# Patient Record
Sex: Female | Born: 1985
Health system: Southern US, Community
[De-identification: ages and names within clinical notes are randomized; demographics above are authoritative.]

## PROBLEM LIST (undated history)

## (undated) DIAGNOSIS — K219 Gastro-esophageal reflux disease without esophagitis: Secondary | ICD-10-CM

## (undated) DIAGNOSIS — I7300 Raynaud's syndrome without gangrene: Secondary | ICD-10-CM

## (undated) DIAGNOSIS — N390 Urinary tract infection, site not specified: Secondary | ICD-10-CM

## (undated) DIAGNOSIS — Z8759 Personal history of other complications of pregnancy, childbirth and the puerperium: Secondary | ICD-10-CM

## (undated) DIAGNOSIS — B019 Varicella without complication: Secondary | ICD-10-CM

## (undated) DIAGNOSIS — M21611 Bunion of right foot: Secondary | ICD-10-CM

## (undated) DIAGNOSIS — M21612 Bunion of left foot: Secondary | ICD-10-CM

## (undated) DIAGNOSIS — I10 Essential (primary) hypertension: Secondary | ICD-10-CM

## (undated) HISTORY — DX: Bunion of right foot: M21.611

## (undated) HISTORY — DX: Essential (primary) hypertension: I10

## (undated) HISTORY — DX: Raynaud's syndrome without gangrene: I73.00

## (undated) HISTORY — DX: Bunion of right foot: M21.612

## (undated) HISTORY — DX: Gastro-esophageal reflux disease without esophagitis: K21.9

## (undated) HISTORY — DX: Varicella without complication: B01.9

## (undated) HISTORY — DX: Urinary tract infection, site not specified: N39.0

## (undated) HISTORY — DX: Personal history of other complications of pregnancy, childbirth and the puerperium: Z87.59

---

## 2011-09-29 DIAGNOSIS — N3090 Cystitis, unspecified without hematuria: Secondary | ICD-10-CM | POA: Insufficient documentation

## 2011-12-13 DIAGNOSIS — N912 Amenorrhea, unspecified: Secondary | ICD-10-CM | POA: Insufficient documentation

## 2011-12-13 DIAGNOSIS — N390 Urinary tract infection, site not specified: Secondary | ICD-10-CM | POA: Insufficient documentation

## 2011-12-13 DIAGNOSIS — I7300 Raynaud's syndrome without gangrene: Secondary | ICD-10-CM | POA: Insufficient documentation

## 2011-12-13 DIAGNOSIS — B029 Zoster without complications: Secondary | ICD-10-CM | POA: Insufficient documentation

## 2012-05-19 DIAGNOSIS — H5210 Myopia, unspecified eye: Secondary | ICD-10-CM | POA: Insufficient documentation

## 2012-05-19 DIAGNOSIS — H52209 Unspecified astigmatism, unspecified eye: Secondary | ICD-10-CM | POA: Insufficient documentation

## 2013-09-19 DIAGNOSIS — Z8349 Family history of other endocrine, nutritional and metabolic diseases: Secondary | ICD-10-CM | POA: Insufficient documentation

## 2014-05-27 DIAGNOSIS — B977 Papillomavirus as the cause of diseases classified elsewhere: Secondary | ICD-10-CM | POA: Insufficient documentation

## 2015-06-12 DIAGNOSIS — K219 Gastro-esophageal reflux disease without esophagitis: Secondary | ICD-10-CM | POA: Insufficient documentation

## 2015-08-19 ENCOUNTER — Ambulatory Visit (INDEPENDENT_AMBULATORY_CARE_PROVIDER_SITE_OTHER): Payer: BC Managed Care – PPO | Admitting: Family Medicine

## 2015-08-19 ENCOUNTER — Encounter: Payer: Self-pay | Admitting: Family Medicine

## 2015-08-19 VITALS — BP 116/74 | HR 80 | Temp 98.1°F

## 2015-08-19 DIAGNOSIS — J069 Acute upper respiratory infection, unspecified: Secondary | ICD-10-CM | POA: Diagnosis not present

## 2015-08-19 DIAGNOSIS — F902 Attention-deficit hyperactivity disorder, combined type: Secondary | ICD-10-CM | POA: Diagnosis not present

## 2015-08-19 DIAGNOSIS — F909 Attention-deficit hyperactivity disorder, unspecified type: Secondary | ICD-10-CM | POA: Insufficient documentation

## 2015-08-19 MED ORDER — AMPHETAMINE-DEXTROAMPHETAMINE 30 MG PO TABS: 30.0000 mg | ORAL_TABLET | Freq: Two times a day (BID) | ORAL | Status: DC

## 2015-08-19 MED ORDER — AMPHETAMINE-DEXTROAMPHETAMINE 5 MG PO TABS: 5.0000 mg | ORAL_TABLET | Freq: Every day | ORAL | Status: DC

## 2015-08-19 NOTE — Progress Notes (Signed)
Patient ID: Jacqueline Craig, female   DOB: 1986/06/27, 29 y.o.   MRN: [id number]  Tommi Rumps, MD Phone: [phone]  Jacqueline Craig is a 29 y.o. female who presents today for new patient visit.  ADHD: Patient notes she's been on medication for ADHD since 2004. Has been followed by her PCP in [place] for this. She is on Adderall 30 mg daily for this, though states when she is not in school she takes half this dose. When she is in school she takes 30 mg most of the time. She does take a 5 mg "boost dose" some days out of week. She notes her major issues with not on the medicine or attention when driving, listening, and reading. She's not had any blood pressure issues or palpitations with this. She does not have any appetite suppression. Notes her weight has been stable.  URI: Patient notes 1-2 days of nonproductive cough and scratchy throat. Notes this started after drinking after her friend at a [employer]. She notes feeling mildly tired with this. She's not had any fevers, earaches, or shortness of breath with this. She is staying well-hydrated.  Active Ambulatory Problems    Diagnosis Date Noted  . Attention deficit hyperactivity disorder (ADHD) 08/19/2015  . URI (upper respiratory infection) 08/19/2015   Resolved Ambulatory Problems    Diagnosis Date Noted  . No Resolved Ambulatory Problems   Past Medical History  Diagnosis Date  . Chickenpox   . GERD (gastroesophageal reflux disease)   . High blood pressure   . UTI (lower urinary tract infection)   . Raynaud phenomenon   . Bilateral bunions     Family History  Problem Relation Age of Onset  . Alcoholism      Parent  . Arthritis      Grandparent  . Prostate cancer      Father  . Hyperlipidemia      Grandparent  . Heart disease      Grandparent  . Stroke      Grandparent  . Hypertension      Grandparent  . Mental illness      Other relative  . Diabetes      Grandparent    Social History   Social  History  . Marital Status: Married    Spouse Name: N/A  . Number of Children: N/A  . Years of Education: N/A   Occupational History  . Not on file.   Social History Main Topics  . Smoking status: Former Research scientist (life sciences)  . Smokeless tobacco: Not on file  . Alcohol Use: 4.2 oz/week    7 Standard drinks or equivalent per week  . Drug Use: No  . Sexual Activity: Not on file   Other Topics Concern  . Not on file   Social History Narrative  . No narrative on file    ROS   General:  Negative for unweight loss, fever Skin: Negative for new or changing mole, sore that won't heal HEENT: Negative for trouble hearing, trouble seeing, ringing in ears, mouth sores, hoarseness, change in voice, dysphagia. CV:  Negative for chest pain, dyspnea, edema, palpitations Resp: Negative for cough, dyspnea, hemoptysis GI: Negative for nausea, vomiting, diarrhea, constipation, abdominal pain, melena, hematochezia. GU: Negative for dysuria, incontinence, urinary hesitance, hematuria, vaginal or penile discharge, polyuria, sexual difficulty, lumps in testicle or breasts MSK: Negative for muscle cramps or aches, joint pain or swelling Neuro: Negative for headaches, weakness, numbness, dizziness, passing out/fainting Psych: Negative for depression, anxiety, memory problems  Objective  Physical Exam Filed Vitals:   08/19/15 0920  BP: 116/74  Pulse: 80  Temp: 98.1 F (36.7 C)   Physical Exam  Constitutional: She is well-developed, well-nourished, and in no distress.  HENT:  Head: Normocephalic and atraumatic.  Right Ear: External ear normal.  Left Ear: External ear normal.  Mouth/Throat: Oropharynx is clear and moist. No oropharyngeal exudate.  Normal TMs bilaterally  Eyes: Conjunctivae are normal. Pupils are equal, round, and reactive to light.  Neck: Neck supple.  Cardiovascular: Normal rate, regular rhythm and normal heart sounds.  Exam reveals no gallop and no friction rub.   No murmur  heard. Pulmonary/Chest: Effort normal. No respiratory distress. She has no wheezes. She has no rales.  Abdominal: Soft. Bowel sounds are normal. She exhibits no distension. There is no tenderness. There is no rebound and no guarding.  Musculoskeletal: She exhibits no edema.  Lymphadenopathy:    She has no cervical adenopathy.  Neurological: She is alert. Gait normal.  Skin: Skin is warm and dry. She is not diaphoretic.  Psychiatric: Mood and affect normal.     Assessment/Plan:   Attention deficit hyperactivity disorder (ADHD) Patient with many year history of ADHD. She brings in documentation from psychological evaluation. Dosing of Adderall confirmed with patient's prior PCP office. Appears well-controlled at this time. Tolerating well. We will refill this for a month and request records. She'll follow-up in one month.  URI (upper respiratory infection) Symptoms likely early phases of viral URI. Patient is well-appearing. Vitals are stable. We will continue to monitor this. She is given return precautions.   Tommi Rumps

## 2015-08-19 NOTE — Assessment & Plan Note (Signed)
Patient with many year history of ADHD. She brings in documentation from psychological evaluation. Dosing of Adderall confirmed with patient's prior PCP office. Appears well-controlled at this time. Tolerating well. We will refill this for a month and request records. She'll follow-up in one month.

## 2015-08-19 NOTE — Patient Instructions (Signed)
Nice to meet you. Please monitor your scratchy throat and cough. If this worsens please let us know.  If you develop shortness of breath, fever, worsening cough, or any new or change in symptoms please seek medical attention.

## 2015-08-19 NOTE — Progress Notes (Signed)
Pre visit review using our clinic review tool, if applicable. No additional management support is needed unless otherwise documented below in the visit note. 

## 2015-08-19 NOTE — Assessment & Plan Note (Addendum)
Symptoms likely early phases of viral URI. Patient is well-appearing. Vitals are stable. We will continue to monitor this. She is given return precautions.

## 2015-09-09 ENCOUNTER — Ambulatory Visit: Payer: BC Managed Care – PPO | Attending: Obstetrics and Gynecology | Admitting: Physical Therapy

## 2015-09-09 DIAGNOSIS — M791 Myalgia: Secondary | ICD-10-CM | POA: Diagnosis present

## 2015-09-09 DIAGNOSIS — IMO0001 Reserved for inherently not codable concepts without codable children: Secondary | ICD-10-CM

## 2015-09-09 DIAGNOSIS — R279 Unspecified lack of coordination: Secondary | ICD-10-CM

## 2015-09-09 DIAGNOSIS — M609 Myositis, unspecified: Secondary | ICD-10-CM | POA: Insufficient documentation

## 2015-09-09 NOTE — Patient Instructions (Addendum)
Read "Why Do I Still Hurt" and "The Breathing Book" and info on self-compassion  Create relaxation corner in apartment with pictures of [place]   Start thinking of strategies to manage stress of grad school:        Make use of psychotherapy services at [place] out down time for relaxation / exercise during school schedule    Handout on log rolling

## 2015-09-10 NOTE — Therapy (Signed)
[place] MAIN [facility] SERVICES [address] [place], [place], [address] Phone: [phone]   Fax:  [phone]  Physical Therapy Evaluation  Patient Details  Name: Nova Worman MRN: [id number] Date of Birth: Mar 26, 1986 Referring Provider: Leafy Ro  Encounter Date: 09/09/2015      PT End of Session - 09/10/15 1557    Visit Number 1   Number of Visits 12   Date for PT Re-Evaluation 10/10/15   PT Start Time 1522   PT Stop Time 1620   PT Time Calculation (min) 58 min   Activity Tolerance Patient tolerated treatment well;No increased pain   Behavior During Therapy [facility] for tasks assessed/performed      Past Medical History  Diagnosis Date  . Chickenpox   . GERD (gastroesophageal reflux disease)   . High blood pressure   . UTI (lower urinary tract infection)   . Raynaud phenomenon   . Bilateral bunions     No past surgical history on file.  There were no vitals filed for this visit.  Visit Diagnosis:  Myalgia and myositis - Plan: PT plan of care cert/re-cert  Lack of coordination - Plan: PT plan of care cert/re-cert      Subjective Assessment - 09/09/15 1609    Subjective Pt reported pain with initial penetration with intercourse and low back pain. Pt expressed concern if some of the exercises (i.e.plank)  she has been performing Marijo File routine) has been causing her pain. Pt was tearful when explaining how performing Madlyn Frankel routine has really helped her to feel stronger and helped to managed her stress during stressful time while being in school and working. 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 Pt reported her  pelvic pain, GI symptoms seem to be worse with times of stress.  Pt has tried various relaxation techniques but has not found them to be helpful.  Pt denied urinary and bowel problems.  Pt reports her husband is very supportive of her current issues.     Pertinent  History Hx of ADHD, anxiety.  Pt experienced death of her father at an early age. Pt recalls her time in [place] brought her happiness and relaxation. Pt reports being sensitive to sound and she has to use background noise or her iphone earphones to drought out noise from her apartment neighbor's dogs.  Pt also is concerned abut her LBP from sitting for long periods of time reading/ studying at her desk. Pt states she enjoys singing and dancing.    Patient Stated Goals relaxation rechniques, not be in pain with intercourse (initial penetration 4/10 to 0/10), low back pain 2/10 to 0/10             [facility] PT Assessment - 09/10/15 1532    Assessment   Medical Diagnosis dyspareunia    Referring Provider Leafy Ro   Precautions   Precautions None   Restrictions   Weight Bearing Restrictions No   Balance Screen   Has the patient fallen in the past 6 months No   Observation/Other Assessments   Observations slumped position, tearful during session   Other Surveys  --  PSQI 57%,ZUNG 49%, (lower % better), FSFI 66% (high % better   Coordination   Gross Motor Movements are Fluid and Coordinated --  abdominal straining w/ cue for bowel movement   Fine Motor Movements are Fluid and Coordinated --  chest breathing   Other:   Other/ Comments will assess Shea Stakes  Michael video routine positions   Posture/Postural Control   Posture Comments lumobopelvic instability (obliques compensation) w/ ASLR    ROM / Strength   AROM / PROM / Strength --  increased mobility in spinal motions   Palpation   Palpation comment tenderness on R deep transverse diaphragm mm of pelvic floor mm (external palpation thru clothes in hookyling)    Bed Mobility   Bed Mobility --  crunch position, able to log roll                   [facility] Adult PT Treatment/Exercise - 09/10/15 1532    Self-Care   Self-Care --  see pt instructions, POC, anatomy and physiology, nn system    Therapeutic Activites    Therapeutic  Activities --  bed mobility and toileting technique                 PT Education - 09/10/15 1556    Education provided Yes   Education Details HEP, POC, goals, biopsychosocial approach to pain, neuroscience of pain, retraining autonomic nn sysytem, anatomy and physiology, motivational interviewing   Person(s) Educated Patient   Methods Explanation;Demonstration;Tactile cues;Verbal cues;Handout   Comprehension Verbalized understanding;Returned demonstration;Verbal cues required;Tactile cues required             PT Long Term Goals - 09/10/15 1607    PT LONG TERM GOAL #1   Title Pt will decrease score on  PSQI 57% to <47% in order to demo improved sleep quality.    Time 12   Period Weeks   Status New   PT LONG TERM GOAL #2   Title Pt will decrease score on ZUNG 49% to < 45% in order to demo IND with  stress/ anxiety management for decreased pain.    Time 12   Period Weeks   Status New   PT LONG TERM GOAL #3   Title Pt will demo increased score on FSFI 66% to > 76% in order to demo improved QOL with decreased pain and increased intimacy with husband.    Time 12   Period Weeks   Status New   PT LONG TERM GOAL #4   Title Pt will demo no lumbopelvic instability with ASLR and dynamic stabilization exercises level 1-4 (3 reps) without cuing in order to apply deep core activation in functional activities to decrease LBP.    Time 12   Period Weeks   Status New   PT LONG TERM GOAL #5   Title Pt will demo proper body mechanics and modifications with proper alignment and mm engagement with 5 Julian Micheals workout exercises in order to minimize relapse of Sx.    Time 12   Period Weeks   Status New   Additional Long Term Goals   Additional Long Term Goals Yes   PT LONG TERM GOAL #6   Title Pt will demo no tenderness / tensions with palpation to pelvic floor mm in order to improve QOL.    Time 12   Period Weeks   Status New               Plan - 09/10/15 1558     Clinical Impression Statement Pt is a 29 yo female whose complaints include dyspareunia and low back pain. Her S & Sx consist of poor stress management skills, poor sleep quality, increased pelvic floor mm tensions, and poor deep core mm coordination and strength with functional activities such as bowel  elimination and bed mobility.  Pt 's body mechanics and form with exercise routine will be assessed at next session for faulty motor control patterns. Pt benefited from a biopsychosocial approach with motivational interviewing and modification of strategies to address her goals as pt has a Hx of poor stress management.     Pt will benefit from skilled therapeutic intervention in order to improve on the following deficits Pain;Decreased mobility;Increased muscle spasms;Decreased range of motion;Decreased safety awareness;Hypermobility;Postural dysfunction;Improper body mechanics;Decreased strength;Decreased endurance;Decreased activity tolerance;Decreased coordination   Rehab Potential Good   PT Frequency 2x / week   PT Duration 12 weeks   PT Treatment/Interventions ADLs/Self Care Home Management;Electrical Stimulation;Moist Heat;Traction;Balance training;Therapeutic exercise;Therapeutic activities;Functional mobility training;Stair training;Gait training;Neuromuscular re-education;Patient/family education;Manual techniques;Manual lymph drainage;Energy conservation;Dry needling;Passive range of motion;Other (comment)  pain science education, relaxation training   Consulted and Agree with Plan of Care Patient         Problem List Patient Active Problem List   Diagnosis Date Noted  . Attention deficit hyperactivity disorder (ADHD) 08/19/2015  . URI (upper respiratory infection) 08/19/2015    Jerl Mina ,PT, DPT, E-RYT  09/10/2015, 9:01 PM  [place] MAIN [facility] SERVICES [address] [place], [place], [address] Phone: [phone]   Fax:   [phone]  Name: Alysson Ressler MRN: [id number] Date of Birth: 23-Feb-1986

## 2015-09-11 ENCOUNTER — Ambulatory Visit: Payer: Self-pay | Admitting: Family Medicine

## 2015-09-15 ENCOUNTER — Ambulatory Visit: Payer: BC Managed Care – PPO | Attending: Obstetrics and Gynecology | Admitting: Physical Therapy

## 2015-09-15 DIAGNOSIS — M609 Myositis, unspecified: Secondary | ICD-10-CM | POA: Insufficient documentation

## 2015-09-15 DIAGNOSIS — IMO0001 Reserved for inherently not codable concepts without codable children: Secondary | ICD-10-CM

## 2015-09-15 DIAGNOSIS — R279 Unspecified lack of coordination: Secondary | ICD-10-CM

## 2015-09-15 DIAGNOSIS — M791 Myalgia: Secondary | ICD-10-CM | POA: Insufficient documentation

## 2015-09-15 NOTE — Therapy (Signed)
[place] MAIN [facility] SERVICES [address] [place], [place], [address] Phone: [phone]   Fax:  [phone]  Physical Therapy Treatment  Patient Details  Name: Jacqueline Craig MRN: [id number] Date of Birth: August 09, 1986 Referring Provider: Leafy Ro  Encounter Date: 09/15/2015      PT End of Session - 09/15/15 1543    Visit Number 2   Number of Visits 12   Date for PT Re-Evaluation 10/10/15   PT Start Time [id number]   PT Stop Time 1415   PT Time Calculation (min) 60 min   Activity Tolerance Patient tolerated treatment well;No increased pain   Behavior During Therapy [facility] for tasks assessed/performed      Past Medical History  Diagnosis Date  . Chickenpox   . GERD (gastroesophageal reflux disease)   . High blood pressure   . UTI (lower urinary tract infection)   . Raynaud phenomenon   . Bilateral bunions     No past surgical history on file.  There were no vitals filed for this visit.  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 Pt practiced yoga sequence as a way to distress from neighbor noise.     Pertinent History Hx of ADHD, anxiety.  Pt experienced death of her father at an early age. Pt recalls her time in [place] brought her happiness and relaxation. Pt reports being sensitive to sound and she has to use background noise or her iphone earphones to drought out noise from her apartment neighbor's dogs.  Pt also is concerned abut her LBP from sitting for long periods of time reading/ studying at her desk. Pt states she enjoys singing and dancing.    Patient Stated Goals relaxation rechniques, not be in pain with intercourse (initial penetration 4/10 to 0/10), low back  pain 2/10 to 0/10             [facility] PT Assessment - 09/15/15 1444    Observation/Other Assessments   Observations large bunions and genu hallux bilaterally    Jumping   Comments pre-Tx: lands on heels, p! on feet, post-Tx: exhale on landing on forefoot no p!    Other:   Other/ Comments down dog: palms too close    Posture/Postural Control   Posture Comments hyperextended knees, chest breathing, pronated feet bilaterally, genu valgus   Palpation   Palpation comment increased tensions at plantar fascia bilaterally                  Pelvic Floor Special Questions - 09/15/15 1502    Pelvic Floor Internal Exam pt consented verbally without contraindications    Exam Type Vaginal   Palpation increased mm tensions on R > L 2nd,  L > R 3 rd layer    Biofeedback able to faciliatate ROM w.  cuing w/ coordinated breathing   cued for pause btw inhale. exhale, less chest breathing            [facility] Adult PT Treatment/Exercise - 09/15/15 1444    Self-Care   Self-Care (p) --  feet massage, wide shoes w/ good sole support   Neuro Re-ed    Neuro Re-ed Details  proper standing posture w/  core engaged from feet up lower kinetic chain  alignment and form in self-selected yoga routine (down dog)    Exercises   Exercises --  frog stretch and child pose rocking    Manual Therapy   Internal Pelvic Floor thiele massage on 2nd and thrid layer of pelvic floor (pre-Tx: tenderness and tensions), Post-Tx : decreased                 PT Education - 09/15/15 1542    Education provided Yes   Education Details HEP   Person(s) Educated Patient   Methods Explanation;Demonstration;Tactile cues;Verbal cues;Handout   Comprehension Verbalized understanding;Returned demonstration             PT Long Term Goals - 09/10/15 1607    PT LONG TERM GOAL #1   Title Pt will decrease score on  PSQI 57% to <47% in order to demo improved sleep quality.    Time 12   Period Weeks   Status  New   PT LONG TERM GOAL #2   Title Pt will decrease score on ZUNG 49% to < 45% in order to demo IND with  stress/ anxiety management for decreased pain.    Time 12   Period Weeks   Status New   PT LONG TERM GOAL #3   Title Pt will demo increased score on FSFI 66% to > 76% in order to demo improved QOL with decreased pain and increased intimacy with husband.    Time 12   Period Weeks   Status New   PT LONG TERM GOAL #4   Title Pt will demo no lumbopelvic instability with ASLR and dynamic stabilization exercises level 1-4 (3 reps) without cuing in order to apply deep core activation in functional activities to decrease LBP.    Time 12   Period Weeks   Status New   PT LONG TERM GOAL #5   Title Pt will demo proper body mechanics and modifications with proper alignment and mm engagement with 5 Julian Micheals workout exercises in order to minimize relapse of Sx.    Time 12   Period Weeks   Status New   Additional Long Term Goals   Additional Long Term Goals Yes   PT LONG TERM GOAL #6   Title Pt will demo no tenderness / tensions with palpation to pelvic floor mm in order to improve QOL.    Time 12   Period Weeks   Status New               Plan - 09/15/15 1543    Clinical Impression Statement Pt tolerated internal manual Tx today with decreased tenderness and mm tensions in the 2nd and 3rd layers of the pelvic floor mm. Pt also demo'd improved pelvic floor ROM with proper coordination of breathing with cuing to slow the breath and to add a pause. Pt required  neuromuscular training to improve collapsed arch in bilateral feet and pain related to bunions within specific exercises in her self-selected fitness routine. Pt will continue to benefit from skilled PT.   Pt will benefit from skilled therapeutic intervention in order to improve on the following deficits Pain;Decreased mobility;Increased muscle spasms;Decreased range of motion;Decreased safety awareness;Hypermobility;Postural  dysfunction;Improper body mechanics;Decreased strength;Decreased endurance;Decreased activity tolerance;Decreased coordination   Rehab Potential Good   PT Frequency 2x / week   PT Duration 12 weeks   PT Treatment/Interventions ADLs/Self Care Home Management;Electrical Stimulation;Moist Heat;Traction;Balance training;Therapeutic exercise;Therapeutic activities;Functional mobility training;Stair training;Gait training;Neuromuscular re-education;Patient/family education;Manual techniques;Manual lymph drainage;Energy  conservation;Dry needling;Passive range of motion;Other (comment)  pain science education, relaxation training   Consulted and Agree with Plan of Care Patient        Problem List Patient Active Problem List   Diagnosis Date Noted  . Attention deficit hyperactivity disorder (ADHD) 08/19/2015  . URI (upper respiratory infection) 08/19/2015    Jerl Mina ,PT, DPT, E-RYT  09/15/2015, 3:48 PM  [place] MAIN [facility] SERVICES [address] [place], [place], [address] Phone: [phone]   Fax:  [phone]  Name: Glodine Elick MRN: [id number] Date of Birth: June 09, 1986

## 2015-09-15 NOTE — Patient Instructions (Signed)
Handouts given

## 2015-09-17 ENCOUNTER — Ambulatory Visit (INDEPENDENT_AMBULATORY_CARE_PROVIDER_SITE_OTHER): Payer: BC Managed Care – PPO | Admitting: Family Medicine

## 2015-09-17 ENCOUNTER — Encounter: Payer: Self-pay | Admitting: Family Medicine

## 2015-09-17 VITALS — BP 104/62 | HR 75 | Temp 98.5°F | Wt 132.4 lb

## 2015-09-17 DIAGNOSIS — R002 Palpitations: Secondary | ICD-10-CM | POA: Insufficient documentation

## 2015-09-17 DIAGNOSIS — F902 Attention-deficit hyperactivity disorder, combined type: Secondary | ICD-10-CM

## 2015-09-17 DIAGNOSIS — Z8719 Personal history of other diseases of the digestive system: Secondary | ICD-10-CM

## 2015-09-17 DIAGNOSIS — R1011 Right upper quadrant pain: Secondary | ICD-10-CM | POA: Insufficient documentation

## 2015-09-17 DIAGNOSIS — Z87891 Personal history of nicotine dependence: Secondary | ICD-10-CM | POA: Diagnosis not present

## 2015-09-17 LAB — CBC
HCT: 40.8 % (ref 36.0–46.0)
Hemoglobin: 13.5 g/dL (ref 12.0–15.0)
MCHC: 33.1 g/dL (ref 30.0–36.0)
MCV: 97.5 fl (ref 78.0–100.0)
PLATELETS: 201 10*3/uL (ref 150.0–400.0)
RBC: 4.19 Mil/uL (ref 3.87–5.11)
RDW: 13.6 % (ref 11.5–15.5)
WBC: 4.8 10*3/uL (ref 4.0–10.5)

## 2015-09-17 MED ORDER — PANTOPRAZOLE SODIUM 40 MG PO TBEC: 40.0000 mg | DELAYED_RELEASE_TABLET | Freq: Every day | ORAL | Status: DC

## 2015-09-17 MED ORDER — AMPHETAMINE-DEXTROAMPHETAMINE 30 MG PO TABS: 30.0000 mg | ORAL_TABLET | Freq: Two times a day (BID) | ORAL | Status: DC

## 2015-09-17 MED ORDER — AMPHETAMINE-DEXTROAMPHETAMINE 5 MG PO TABS: 5.0000 mg | ORAL_TABLET | Freq: Every day | ORAL | Status: DC

## 2015-09-17 MED ORDER — BUPROPION HCL ER (XL) 300 MG PO TB24: 300.0000 mg | ORAL_TABLET | Freq: Every day | ORAL | Status: DC

## 2015-09-17 NOTE — Assessment & Plan Note (Signed)
Patient with recent onset of fluttering sensation. Lasts very briefly. No other symptoms with this. EKG today is reassuring. She had a TSH and electrolytes were checked recently and they're in the normal range. We will check a CBC to make sure she is not anemic. I had a discussion about possible causes including caffeine, Adderall, and PVCs. Symptoms would likely indicate PVCs given brief duration and lack of other symptoms and this can be exacerbated by caffeine intake. She will decrease her caffeine intake. 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 cardiology referral is placed. She is given return precautions.

## 2015-09-17 NOTE — Progress Notes (Signed)
Pre visit review using our clinic review tool, if applicable. No additional management support is needed unless otherwise documented below in the visit note. 

## 2015-09-17 NOTE — Assessment & Plan Note (Signed)
Symptoms well controlled at this time. We will refill Protonix. She has benign abdominal exam. No blood in her stool. Advised to avoid triggers. She is given return precautions.

## 2015-09-17 NOTE — Assessment & Plan Note (Signed)
Stable at this time. We will refill Adderall. I did discuss that we may need to decrease this if she continues to have palpitations despite decreasing her caffeine intake.

## 2015-09-17 NOTE — Progress Notes (Addendum)
Patient ID: Jacqueline Craig, female   DOB: September 15, 1986, 29 y.o.   MRN: [id number]  Tommi Rumps, MD Phone: [phone]  Jacqueline Craig is a 29 y.o. female who presents today for follow-up.  GERD/gastritis, history of: Patient reports a history of gastritis in the past. She had an EGD that revealed this. She did not have any esophagitis. This is per patient report. She's been on Protonix since that time. She had been taking it twice daily, though is now only taking it once daily. This controls her symptoms. Symptoms are worse with drinking acidic drinks, coffee, chewing minty gum, and taking ibuprofen. She has no blood in her stool. She has no abdominal pain now.  ADHD: Patient reports Adderall is helpful. Helps her focus. She is only taking 30 mg in the morning now and 5 mg at night. She notes when she is in class she takes 30 mg twice a day and 5 mg boost in the afternoon. Notes her appetite is good. She has not had any blood pressure or tachycardia issues, though does report an occasional fluttering. See below for documentation of that issue.  History of tobacco abuse: Patient notes she stopped smoking in 2005. She is on nicotine lozenges until 2010. She was started on Wellbutrin 2008 for this issue. She notes this helped her quit using tobacco and nicotine products. She does have a history of depression, though has not had depression in some time and is not on Wellbutrin for depression. She has been continued on Wellbutrin since she started it. She's not interested in stopping it at this time.  Palpitations: Patient notes intermittent fluttering sensation in her chest. She notes it typically occurs after drinking caffeinated drinks, though does occur at other times. She has no chest pain or shortness of breath with this. No lightheadedness with this. It is not exertional. These last for less than a couple of seconds at a time. Has been going on for "a little while". Do not happen every day. No  history of these prior. Not ever had an EKG.  PMH: Former smoker.   ROS see history of present illness  Objective  Physical Exam Filed Vitals:   09/17/15 0956  BP: 104/62  Pulse: 75  Temp: 98.5 F (36.9 C)   Physical Exam  Constitutional: She is well-developed, well-nourished, and in no distress.  HENT:  Head: Normocephalic and atraumatic.  Right Ear: External ear normal.  Left Ear: External ear normal.  Mouth/Throat: Oropharynx is clear and moist. No oropharyngeal exudate.  Neck: Neck supple.  Cardiovascular: Normal rate, regular rhythm and normal heart sounds.   Pulmonary/Chest: Effort normal and breath sounds normal.  Abdominal: Soft. Bowel sounds are normal. She exhibits no distension. There is no tenderness. There is no rebound and no guarding.  Lymphadenopathy:    She has no cervical adenopathy.  Neurological: She is alert. Gait normal.  Skin: Skin is warm and dry. She is not diaphoretic.   EKG: Sinus rhythm, rate 86, short PR interval, no ST or T-wave changes, no arrhythmias seen  Assessment/Plan: Please see individual problem list.  Attention deficit hyperactivity disorder (ADHD) Stable at this time. We will refill Adderall. I did discuss that we may need to decrease this if she continues to have palpitations despite decreasing her caffeine intake.  History of gastritis Symptoms well controlled at this time. We will refill Protonix. She has benign abdominal exam. No blood in her stool. Advised to avoid triggers. She is given return precautions.  History of tobacco  abuse Former smoker. Has been on Wellbutrin for the last 8 years. This was to help her quit smoking. She has been stable on this. I discussed coming off of it given that she is no longer using tobacco products, though patient states she would like to remain on it at this time. We will provide a refill. She will consider coming off of it and let us know if she would like to do  this.  Palpitations Patient with recent onset of fluttering sensation. Lasts very briefly. No other symptoms with this. EKG today is reassuring. She had a TSH and electrolytes were checked recently and they're in the normal range. We will check a CBC to make sure she is not anemic. I had a discussion about possible causes including caffeine, Adderall, and PVCs. Symptoms would likely indicate PVCs given brief duration and lack of other symptoms and this can be exacerbated by caffeine intake. She will decrease her caffeine intake. 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 cardiology referral is placed. She is given return precautions.    Orders Placed This Encounter  Procedures  . CBC  . Ambulatory referral to Cardiology    Referral Priority:  Routine    Referral Type:  Consultation    Referral Reason:  Specialty Services Required    Requested Specialty:  Cardiology    Number of Visits Requested:  1  . EKG 12-Lead    Meds ordered this encounter  Medications  . buPROPion (WELLBUTRIN XL) 300 MG 24 hr tablet    Sig: Take 1 tablet (300 mg total) by mouth daily.    Dispense:  90 tablet    Refill:  1  . pantoprazole (PROTONIX) 40 MG tablet    Sig: Take 1 tablet (40 mg total) by mouth daily.    Dispense:  90 tablet    Refill:  1  . amphetamine-dextroamphetamine (ADDERALL) 30 MG tablet    Sig: Take 1 tablet by mouth 2 (two) times daily.    Dispense:  60 tablet    Refill:  0  . amphetamine-dextroamphetamine (ADDERALL) 5 MG tablet    Sig: Take 1 tablet (5 mg total) by mouth daily.    Dispense:  30 tablet    Refill:  0  . amphetamine-dextroamphetamine (ADDERALL) 30 MG tablet    Sig: Take 1 tablet by mouth 2 (two) times daily. Do not fill until 10/18/15    Dispense:  60 tablet    Refill:  0  . amphetamine-dextroamphetamine  (ADDERALL) 5 MG tablet    Sig: Take 1 tablet (5 mg total) by mouth daily. Do not fill until 10/18/15    Dispense:  30 tablet    Refill:  0  . amphetamine-dextroamphetamine (ADDERALL) 30 MG tablet    Sig: Take 1 tablet by mouth 2 (two) times daily. Please do not fill until 11/18/15    Dispense:  60 tablet    Refill:  0  . amphetamine-dextroamphetamine (ADDERALL) 5 MG tablet    Sig: Take 1 tablet (5 mg total) by mouth daily. Please do not fill until 11/18/15    Dispense:  30 tablet    Refill:  0    Tommi Rumps  \

## 2015-09-17 NOTE — Assessment & Plan Note (Signed)
Former smoker. Has been on Wellbutrin for the last 8 years. This was to help her quit smoking. She has been stable on this. I discussed coming off of it given that she is no longer using tobacco products, though patient states she would like to remain on it at this time. We will provide a refill. She will consider coming off of it and let us know if she would like to do this.

## 2015-09-17 NOTE — Patient Instructions (Signed)
Nice to see you. We will refill your medications. We will check a CBC to rule out anemia as a cause of your palpitations. Please decrease your caffeine intake. Please continue to monitor the palpitations. They continue to be an issue please let us know. If you develop chest pain or trouble breathing with an please let us know as well. If you develop chest pain, shortness of breath, palpitations, abdominal pain, blood in her stool, or any new or changing symptoms please seek medical attention.

## 2015-09-18 ENCOUNTER — Ambulatory Visit: Payer: BC Managed Care – PPO | Admitting: Physical Therapy

## 2015-09-18 DIAGNOSIS — R279 Unspecified lack of coordination: Secondary | ICD-10-CM

## 2015-09-18 DIAGNOSIS — M791 Myalgia: Secondary | ICD-10-CM | POA: Diagnosis not present

## 2015-09-18 DIAGNOSIS — IMO0001 Reserved for inherently not codable concepts without codable children: Secondary | ICD-10-CM

## 2015-09-19 NOTE — Therapy (Signed)
[place] MAIN [facility] SERVICES [address] [place], [place], [address] Phone: [phone]   Fax:  [phone]  Physical Therapy Treatment  Patient Details  Name: Jacqueline Craig MRN: [id number] Date of Birth: 11-21-1985 Referring Provider: Leafy Ro  Encounter Date: 09/18/2015      PT End of Session - 09/19/15 2202    Visit Number 3   Number of Visits 12   Date for PT Re-Evaluation 10/10/15   PT Start Time 0918   PT Stop Time 1015   PT Time Calculation (min) 57 min   Activity Tolerance Patient tolerated treatment well;No increased pain   Behavior During Therapy [facility] for tasks assessed/performed      Past Medical History  Diagnosis Date  . Chickenpox   . GERD (gastroesophageal reflux disease)   . High blood pressure   . UTI (lower urinary tract infection)   . Raynaud phenomenon   . Bilateral bunions     No past surgical history on file.  There were no vitals filed for this visit.  Visit Diagnosis:  Myalgia and myositis  Lack of coordination      Subjective Assessment - 09/18/15 0921    Subjective Pt reported she has practiced her HEP. Pt had no complaints from last sessions.  Pt has practiced landing with proper body mechanics and has had less knee/feet pain during fitness routine.    Pertinent History Hx of ADHD, anxiety.  Pt experienced death of her father at an early age. Pt recalls her time in [place] brought her happiness and relaxation. Pt reports being sensitive to sound and she has to use background noise or her iphone earphones to drought out noise from her apartment neighbor's dogs.  Pt also is concerned abut her LBP from sitting for long periods of time reading/ studying at her desk. Pt states she enjoys singing and dancing.    Patient Stated Goals relaxation rechniques, not be in pain with intercourse (initial penetration 4/10 to 0/10), low back pain 2/10 to 0/10                       Pelvic Floor  Special Questions - 09/19/15 1437    Pelvic Floor Internal Exam pt consented verbally without contraindications    Exam Type Vaginal   Palpation decreased mm tensions on R > L 2nd,  L > R 3 rd layer    Biofeedback improved breathing coordination           [facility] Adult PT Treatment/Exercise - 09/19/15 1437    Self-Care   Self-Care --  explained about dilator use as an option for self-management   Other Self-Care Comments  guided body scan and explained the principles od restroative yoga with use of props    Therapeutic Activites    Therapeutic Activities --  car seat modifification to maintain neutral spine    Manual Therapy   Internal Pelvic Floor thiele massage on 2nd and thrid layer of pelvic floor (pre-Tx: tenderness and tensions), Post-Tx : decreased                      PT Long Term Goals - 09/19/15 1443    PT LONG TERM GOAL #1   Title Pt will decrease score on  PSQI 57% to <47% in order to demo improved sleep quality.    Time 12   Period Weeks   Status On-going   PT LONG TERM GOAL #2   Title  Pt will decrease score on ZUNG 49% to < 45% in order to demo IND with  stress/ anxiety management for decreased pain.    Time 12   Period Weeks   Status On-going   PT LONG TERM GOAL #3   Title Pt will demo increased score on FSFI 66% to > 76% in order to demo improved QOL with decreased pain and increased intimacy with husband.    Time 12   Period Weeks   Status On-going   PT LONG TERM GOAL #4   Title Pt will demo no lumbopelvic instability with ASLR and dynamic stabilization exercises level 1-4 (3 reps) without cuing in order to apply deep core activation in functional activities to decrease LBP.    Time 12   Period Weeks   Status On-going   PT LONG TERM GOAL #5   Title Pt will demo proper body mechanics and modifications with proper alignment and mm engagement with 5 Julian Micheals workout exercises in order to minimize relapse of Sx.    Time 12   Period Weeks    Status New   PT LONG TERM GOAL #6   Title Pt will demo no tenderness / tensions with palpation to pelvic floor mm in order to improve QOL.    Time 12   Period Weeks   Status Partially Met               Plan - 09/19/15 1445    Clinical Impression Statement Pt is progressing well with more symmetrical softness to pelvic floor mm bilaterally  and minor tenderness in deepest pelvic floor mm. Pt showed good carry over with previous neuromuscular training. Plan to progress deep core exercises and to modify workout fitness to minimize relapse of Sx.    Pt will benefit from skilled therapeutic intervention in order to improve on the following deficits Pain;Decreased mobility;Increased muscle spasms;Decreased range of motion;Decreased safety awareness;Hypermobility;Postural dysfunction;Improper body mechanics;Decreased strength;Decreased endurance;Decreased activity tolerance;Decreased coordination   Rehab Potential Good   PT Frequency 2x / week   PT Duration 12 weeks   PT Treatment/Interventions ADLs/Self Care Home Management;Electrical Stimulation;Moist Heat;Traction;Balance training;Therapeutic exercise;Therapeutic activities;Functional mobility training;Stair training;Gait training;Neuromuscular re-education;Patient/family education;Manual techniques;Manual lymph drainage;Energy conservation;Dry needling;Passive range of motion;Other (comment)   Consulted and Agree with Plan of Care Patient        Problem List Patient Active Problem List   Diagnosis Date Noted  . History of gastritis 09/17/2015  . History of tobacco abuse 09/17/2015  . Palpitations 09/17/2015  . Attention deficit hyperactivity disorder (ADHD) 08/19/2015  . URI (upper respiratory infection) 08/19/2015    Jerl Mina  ,PT, DPT, E-RYT  09/19/2015, 2:48 PM  [place] MAIN [facility] SERVICES [address] [place], [place], [address] Phone: [phone]   Fax:   [phone]  Name: Amia Rynders MRN: [id number] Date of Birth: January 20, 1986

## 2015-09-22 ENCOUNTER — Ambulatory Visit: Payer: BC Managed Care – PPO | Admitting: Physical Therapy

## 2015-09-23 ENCOUNTER — Encounter: Payer: BC Managed Care – PPO | Admitting: Physical Therapy

## 2015-09-25 ENCOUNTER — Ambulatory Visit: Payer: BC Managed Care – PPO | Admitting: Physical Therapy

## 2015-09-25 DIAGNOSIS — R279 Unspecified lack of coordination: Secondary | ICD-10-CM

## 2015-09-25 DIAGNOSIS — M791 Myalgia: Secondary | ICD-10-CM | POA: Diagnosis not present

## 2015-09-25 DIAGNOSIS — IMO0001 Reserved for inherently not codable concepts without codable children: Secondary | ICD-10-CM

## 2015-09-26 NOTE — Patient Instructions (Signed)
You are now ready to begin training the deep core muscles system: diaphragm, transverse abdominis, pelvic floor . These muscles must work together as a team.           The key to these exercises to train the brain to coordinate the timing of these muscles and to have them turn on for long periods of time to hold you upright against gravity (especially important if you are on your feet all day).These muscles are postural muscles and play a role stabilizing your spine and bodyweight. By doing these repetitions slowly and correctly instead of doing crunches, you will achieve a flatter belly without a lower pooch. You are also placing your spine in a more neutral position and breathing properly which in turn, decreases your risk for problems related to your pelvic floor, abdominal, and low back such as pelvic organ prolapse, hernias, diastasis recti (separation of superficial muscles), disk herniations, spinal fractures. These exercises set a solid foundation for you to later progress to resistance/ strength training with therabands and weights and return to other typical fitness exercises with a stronger deeper core.   Do level 1 with pillow under hips, Do level 2 30 reps instead of sit ups during Jacqueline Craig workout

## 2015-09-26 NOTE — Therapy (Addendum)
[place] MAIN [facility] SERVICES [address] [place], [place], [address] Phone: [phone]   Fax:  [phone]  Physical Therapy Treatment  Patient Details  Name: Jacqueline Craig MRN: [id number] Date of Birth: October 25, 1985 Referring Provider: Leafy Ro  Encounter Date: 09/25/2015      PT End of Session - 09/26/15 1946    Visit Number 3   Number of Visits 12   Date for PT Re-Evaluation 10/10/15   PT Start Time 1155   PT Stop Time 2080   PT Time Calculation (min) 60 min   Activity Tolerance Patient tolerated treatment well;No increased pain   Behavior During Therapy [facility] for tasks assessed/performed      Past Medical History  Diagnosis Date  . Chickenpox   . GERD (gastroesophageal reflux disease)   . High blood pressure   . UTI (lower urinary tract infection)   . Raynaud phenomenon   . Bilateral bunions     No past surgical history on file.  There were no vitals filed for this visit.  Visit Diagnosis:  Myalgia and myositis  Lack of coordination      Subjective Assessment - 09/25/15 1517    Subjective Pt reported her drive to [place] was not bad her back with use of the towels. Pt noticed her back did not bother her despite picking up a friend's baby ~30 lbs repeatedly across one week.    Pertinent History Hx of ADHD, anxiety.  Pt experienced death of her father at an early age. Pt recalls her time in [place] brought her happiness and relaxation. Pt reports being sensitive to sound and she has to use background noise or her iphone earphones to drought out noise from her apartment neighbor's dogs.  Pt also is concerned abut her LBP from sitting for long periods of time reading/ studying at her desk. Pt states she enjoys singing and dancing.    Patient Stated Goals relaxation rechniques, not be in pain with intercourse (initial penetration 4/10 to 0/10), low back pain 2/10 to 0/10                       Pelvic Floor Special  Questions - 09/26/15 1936    Pelvic Floor Internal Exam pt consented verbally without contraindications    Exam Type Vaginal   Palpation no mm tensions/tenderness. bladder more dorsal position w/ posterior activation of pelvic floor contraction (quick), post-Tx:  bladder more cephalad position and more circumferential contraction    Strength fair squeeze, definite lift   Strength # of reps 10   Strength # of seconds 1           [facility] Adult PT Treatment/Exercise - 09/26/15 1942    Self-Care   Other Self-Care Comments  modified Clemetine Marker work out and educated the importance of avoiding sit-up and crunches   Neuro Re-ed    Neuro Re-ed Details  dynamic stabilization 1-2 (10 reps) , pelvic floor ROM and quick activation,   verbal, visual, tactile cues for pelvic floor activation   Manual Therapy   Myofascial Release suprapubic area   Internal Pelvic Floor anterior pelvic floor faciliation                 PT Education - 09/26/15 1945    Education provided Yes   Education Details HEP   Person(s) Educated Patient   Methods Explanation;Demonstration;Tactile cues;Verbal cues;Handout   Comprehension Verbalized understanding;Returned demonstration;Verbal cues required;Tactile cues required  PT Long Term Goals - 09/19/15 1443    PT LONG TERM GOAL #1   Title Pt will decrease score on  PSQI 57% to <47% in order to demo improved sleep quality.    Time 12   Period Weeks   Status On-going   PT LONG TERM GOAL #2   Title Pt will decrease score on ZUNG 49% to < 45% in order to demo IND with  stress/ anxiety management for decreased pain.    Time 12   Period Weeks   Status On-going   PT LONG TERM GOAL #3   Title Pt will demo increased score on FSFI 66% to > 76% in order to demo improved QOL with decreased pain and increased intimacy with husband.    Time 12   Period Weeks   Status On-going   PT LONG TERM GOAL #4   Title Pt will demo no lumbopelvic instability  with ASLR and dynamic stabilization exercises level 1-4 (3 reps) without cuing in order to apply deep core activation in functional activities to decrease LBP.    Time 12   Period Weeks   Status On-going   PT LONG TERM GOAL #5   Title Pt will demo proper body mechanics and modifications with proper alignment and mm engagement with 5 Julian Micheals workout exercises in order to minimize relapse of Sx.    Time 12   Period Weeks   Status New   PT LONG TERM GOAL #6   Title Pt will demo no tenderness / tensions with palpation to pelvic floor mm in order to improve QOL.    Time 12   Period Weeks   Status Partially Met               Plan - 09/26/15 1946    Clinical Impression Statement Pt benefitted from manual Tx to achieve proper pelvic floor mm ROM and strengthening. Noted slight dorsal position of bladder intitially with more posterior activation of pelvic floor mm. Post-Tx, noted more cephalad position  of bladder and more cicrumferential contraction of all pelvic floor mm.  Pt progressed to deep core strengthening exercises. Pt will continue to benefit from skilled PT.    Pt will benefit from skilled therapeutic intervention in order to improve on the following deficits Pain;Decreased mobility;Increased muscle spasms;Decreased range of motion;Decreased safety awareness;Hypermobility;Postural dysfunction;Improper body mechanics;Decreased strength;Decreased endurance;Decreased activity tolerance;Decreased coordination   Rehab Potential Good   PT Frequency 2x / week   PT Duration 12 weeks   PT Treatment/Interventions ADLs/Self Care Home Management;Electrical Stimulation;Moist Heat;Traction;Balance training;Therapeutic exercise;Therapeutic activities;Functional mobility training;Stair training;Gait training;Neuromuscular re-education;Patient/family education;Manual techniques;Manual lymph drainage;Energy conservation;Dry needling;Passive range of motion;Other (comment)   Consulted and  Agree with Plan of Care Patient        Problem List Patient Active Problem List   Diagnosis Date Noted  . History of gastritis 09/17/2015  . History of tobacco abuse 09/17/2015  . Palpitations 09/17/2015  . Attention deficit hyperactivity disorder (ADHD) 08/19/2015  . URI (upper respiratory infection) 08/19/2015    Jerl Mina ,PT, DPT, E-RYT  09/26/2015, 7:50 PM  [place] MAIN [facility] SERVICES [address] [place], [place], [address] Phone: [phone]   Fax:  [phone]  Name: Jacqueline Craig MRN: [id number] Date of Birth: 1986/09/22   PHYSICAL THERAPY DISCHARGE SUMMARY  Visits from Start of Care: 3   Plan: Patient agrees to discharge.  Patient goals were partially met. Patient is being discharged due to not returning since the last  visit.  ?????     Lyndee Hensen, PT, DPT 2:39 PM  12/27/18

## 2015-10-01 ENCOUNTER — Ambulatory Visit: Payer: BC Managed Care – PPO | Admitting: Physical Therapy

## 2015-10-08 ENCOUNTER — Encounter: Payer: BC Managed Care – PPO | Admitting: Physical Therapy

## 2015-10-10 ENCOUNTER — Encounter: Payer: BC Managed Care – PPO | Admitting: Physical Therapy

## 2015-10-22 ENCOUNTER — Encounter: Payer: Self-pay | Admitting: Physical Therapy

## 2015-10-22 ENCOUNTER — Encounter: Payer: Self-pay | Admitting: Family Medicine

## 2015-11-10 ENCOUNTER — Encounter: Payer: Self-pay | Admitting: Family Medicine

## 2015-11-12 ENCOUNTER — Ambulatory Visit: Payer: Self-pay | Admitting: Cardiovascular Disease

## 2015-11-13 ENCOUNTER — Ambulatory Visit (INDEPENDENT_AMBULATORY_CARE_PROVIDER_SITE_OTHER): Payer: BC Managed Care – PPO

## 2015-11-13 DIAGNOSIS — Z23 Encounter for immunization: Secondary | ICD-10-CM

## 2015-11-13 NOTE — Progress Notes (Signed)
Patient came in for last Hepatitis B series vaccine.  Received in Left deltoid.  Patient tolerated well.

## 2015-12-17 ENCOUNTER — Ambulatory Visit: Payer: Self-pay | Admitting: Family Medicine

## 2016-01-08 ENCOUNTER — Ambulatory Visit (INDEPENDENT_AMBULATORY_CARE_PROVIDER_SITE_OTHER): Payer: BC Managed Care – PPO | Admitting: Family Medicine

## 2016-01-08 ENCOUNTER — Encounter: Payer: Self-pay | Admitting: Family Medicine

## 2016-01-08 VITALS — BP 110/70 | HR 91 | Temp 98.2°F | Ht 65.5 in | Wt 129.0 lb

## 2016-01-08 DIAGNOSIS — F902 Attention-deficit hyperactivity disorder, combined type: Secondary | ICD-10-CM

## 2016-01-08 DIAGNOSIS — R002 Palpitations: Secondary | ICD-10-CM | POA: Diagnosis not present

## 2016-01-08 MED ORDER — AMPHETAMINE-DEXTROAMPHETAMINE 5 MG PO TABS: 5.0000 mg | ORAL_TABLET | Freq: Every day | ORAL | Status: DC

## 2016-01-08 MED ORDER — AMPHETAMINE-DEXTROAMPHETAMINE 20 MG PO TABS: 20.0000 mg | ORAL_TABLET | Freq: Two times a day (BID) | ORAL | Status: DC

## 2016-01-08 MED ORDER — AMPHETAMINE-DEXTROAMPHETAMINE 20 MG PO TABS: 20.0000 mg | ORAL_TABLET | Freq: Every day | ORAL | Status: DC

## 2016-01-08 NOTE — Progress Notes (Signed)
Pre visit review using our clinic review tool, if applicable. No additional management support is needed unless otherwise documented below in the visit note. 

## 2016-01-08 NOTE — Assessment & Plan Note (Signed)
Continues to have issues with this. No chest pain or shortness of breath associated with this. Some of her symptoms sound as though they could be PVCs others sound as though they are persistent palpitations. I discussed possible causes including her caffeine intake and her Adderall in addition to possible Wolff-Parkinson-White based on her short PR interval. Discussed cardiology evaluation and she noted she would think on this and call them as she has previously put this off. We will decrease her dose of Adderall to 20 mg twice daily to see if this will be beneficial. She will continue to monitor with this decrease. She's given return precautions.

## 2016-01-08 NOTE — Progress Notes (Signed)
Patient ID: Jacqueline Craig, female   DOB: 1986/02/08, 30 y.o.   MRN: [id number]  Tommi Rumps, MD Phone: [phone]  Jacqueline Craig is a 30 y.o. female who presents today for follow-up.  ADHD: Patient notes she is taking Adderall 30 mg twice a day and then taking 5 mg as a boost dose in the afternoon. Notes appetite is okay. Weight has been okay. Notes these doses are effective.  She does note continued intermittent palpitations that she describes as a skipped beat. Occasionally does note heart racing and she checks her heart rate and it is in the low 100s. She does drink a lot of caffeine. She denies chest pain and shortness of breath with this. She does have significant amount of anxiety surrounding school. I reviewed her EKG and went over it with her. Discussed short PR interval potential for Wolff-Parkinson-White syndrome as a cause of her palpitations  PMH: Former smoker   ROS see history of present illness  Objective  Physical Exam Filed Vitals:   01/08/16 1400  BP: 110/70  Pulse: 91  Temp: 98.2 F (36.8 C)    BP Readings from Last 3 Encounters:  01/08/16 110/70  09/17/15 104/62  08/19/15 116/74   Wt Readings from Last 3 Encounters:  01/08/16 129 lb (58.514 kg)  09/17/15 132 lb 6.4 oz (60.056 kg)    Physical Exam  Constitutional: She is well-developed, well-nourished, and in no distress.  HENT:  Head: Normocephalic and atraumatic.  Right Ear: External ear normal.  Left Ear: External ear normal.  Mouth/Throat: Oropharynx is clear and moist. No oropharyngeal exudate.  Eyes: Conjunctivae are normal. Pupils are equal, round, and reactive to light.  Cardiovascular: Normal rate, regular rhythm and normal heart sounds.  Exam reveals no gallop and no friction rub.   No murmur heard. Pulmonary/Chest: Effort normal and breath sounds normal. No respiratory distress. She has no wheezes. She has no rales.  Neurological: She is alert. Gait normal.  Skin: Skin is warm  and dry. She is not diaphoretic.     Assessment/Plan: Please see individual problem list.  Attention deficit hyperactivity disorder (ADHD) Well-controlled. Given persistent intermittent palpitations we will decrease the dose to 20 mg of Adderall twice daily and continue 5 mg dosing. She'll continue to monitor.  Palpitations Continues to have issues with this. No chest pain or shortness of breath associated with this. Some of her symptoms sound as though they could be PVCs others sound as though they are persistent palpitations. I discussed possible causes including her caffeine intake and her Adderall in addition to possible Wolff-Parkinson-White based on her short PR interval. Discussed cardiology evaluation and she noted she would think on this and call them as she has previously put this off. We will decrease her dose of Adderall to 20 mg twice daily to see if this will be beneficial. She will continue to monitor with this decrease. She's given return precautions.    No orders of the defined types were placed in this encounter.    Meds ordered this encounter  Medications  . amphetamine-dextroamphetamine (ADDERALL) 5 MG tablet    Sig: Take 1 tablet (5 mg total) by mouth daily.    Dispense:  30 tablet    Refill:  0  . amphetamine-dextroamphetamine (ADDERALL) 5 MG tablet    Sig: Take 1 tablet (5 mg total) by mouth daily. Do not fill until 02/08/16    Dispense:  30 tablet    Refill:  0  . amphetamine-dextroamphetamine (ADDERALL) 5 MG  tablet    Sig: Take 1 tablet (5 mg total) by mouth daily. Please do not fill until 03/09/16    Dispense:  30 tablet    Refill:  0  . amphetamine-dextroamphetamine (ADDERALL) 20 MG tablet    Sig: Take 1 tablet (20 mg total) by mouth 2 (two) times daily. Do not fill until 02/08/16    Dispense:  60 tablet    Refill:  0  . amphetamine-dextroamphetamine (ADDERALL) 20 MG tablet    Sig: Take 1 tablet (20 mg total) by mouth 2 (two) times daily.    Dispense:  60  tablet    Refill:  0  . amphetamine-dextroamphetamine (ADDERALL) 20 MG tablet    Sig: Take 1 tablet (20 mg total) by mouth daily. Do not fill until 03/09/16    Dispense:  60 tablet    Refill:  0   Tommi Rumps, MD [place]

## 2016-01-08 NOTE — Assessment & Plan Note (Signed)
Well-controlled. Given persistent intermittent palpitations we will decrease the dose to 20 mg of Adderall twice daily and continue 5 mg dosing. She'll continue to monitor.

## 2016-01-08 NOTE — Patient Instructions (Signed)
Nice to see you. We are going to decrease your Adderall dose to 20 mg twice daily. Please continue to monitor your palpitations and anxiety. If these worsen please let us know. If you develop palpitations, chest pain, shortness of breath, or any new or changing symptoms please seek medical attention.

## 2016-01-13 ENCOUNTER — Encounter: Payer: Self-pay | Admitting: Family Medicine

## 2016-02-11 ENCOUNTER — Encounter: Payer: Self-pay | Admitting: Cardiology

## 2016-02-11 ENCOUNTER — Ambulatory Visit (INDEPENDENT_AMBULATORY_CARE_PROVIDER_SITE_OTHER): Payer: BLUE CROSS/BLUE SHIELD | Admitting: Cardiology

## 2016-02-11 VITALS — BP 110/68 | HR 79 | Ht 65.5 in | Wt 130.3 lb

## 2016-02-11 DIAGNOSIS — R002 Palpitations: Secondary | ICD-10-CM | POA: Diagnosis not present

## 2016-02-11 DIAGNOSIS — R079 Chest pain, unspecified: Secondary | ICD-10-CM | POA: Diagnosis not present

## 2016-02-11 DIAGNOSIS — R9431 Abnormal electrocardiogram [ECG] [EKG]: Secondary | ICD-10-CM

## 2016-02-11 NOTE — Patient Instructions (Addendum)
Medication Instructions:  Your physician recommends that you continue on your current medications as directed. Please refer to the Current Medication list given to you today.   Labwork: None ordered  Testing/Procedures: Your physician has requested that you have an echocardiogram. Echocardiography is a painless test that uses sound waves to create images of your heart. It provides your doctor with information about the size and shape of your heart and how well your heart's chambers and valves are working. This procedure takes approximately one hour. There are no restrictions for this procedure.  Date & Time: ________________________________________________________________  Your physician has recommended that you wear a holter monitor. Holter monitors are medical devices that record the heart's electrical activity. Doctors most often use these monitors to diagnose arrhythmias. Arrhythmias are problems with the speed or rhythm of the heartbeat. The monitor is a small, portable device. You can wear one while you do your normal daily activities. This is usually used to diagnose what is causing palpitations/syncope (passing out).  Date & Time: _________________________________________________________________  Your physician has requested that you have a stress echocardiogram. For further information please visit [email or web address]. Please follow instruction sheet as given.  Date & Time: ________________________________________________________________  Follow-Up: Your physician recommends that you schedule a follow-up appointment after testing to review results.  Date & Time: ________________________________________________________________   Any Other Special Instructions Will Be Listed Below (If Applicable).     If you need a refill on your cardiac medications before your next appointment, please call your pharmacy.  Echocardiogram An echocardiogram, or echocardiography, uses sound  waves (ultrasound) to produce an image of your heart. The echocardiogram is simple, painless, obtained within a short period of time, and offers valuable information to your health care provider. The images from an echocardiogram can provide information such as:  Evidence of coronary artery disease (CAD).  Heart size.  Heart muscle function.  Heart valve function.  Aneurysm detection.  Evidence of a past heart attack.  Fluid buildup around the heart.  Heart muscle thickening.  Assess heart valve function. LET [facility] CARE PROVIDER KNOW ABOUT:  Any allergies you have.  All medicines you are taking, including vitamins, herbs, eye drops, creams, and over-the-counter medicines.  Previous problems you or members of your family have had with the use of anesthetics.  Any blood disorders you have.  Previous surgeries you have had.  Medical conditions you have.  Possibility of pregnancy, if this applies. BEFORE THE PROCEDURE  No special preparation is needed. Eat and drink normally.  PROCEDURE   In order to produce an image of your heart, gel will be applied to your chest and a wand-like tool (transducer) will be moved over your chest. The gel will help transmit the sound waves from the transducer. The sound waves will harmlessly bounce off your heart to allow the heart images to be captured in real-time motion. These images will then be recorded.  You may need an IV to receive a medicine that improves the quality of the pictures. AFTER THE PROCEDURE You may return to your normal schedule including diet, activities, and medicines, unless your health care provider tells you otherwise.   This information is not intended to replace advice given to you by your health care provider. Make sure you discuss any questions you have with your health care provider.   Document Released: 09/24/2000 Document Revised: 10/18/2014 Document Reviewed: 06/04/2013 Elsevier Interactive Patient  Education 2016 [employer].   Exercise Stress Echocardiogram An exercise stress echocardiogram is a heart (  cardiac) test used to check the function of your heart. This test may also be called an exercise stress echocardiography or stress echo. This stress test will check how well your heart muscle and valves are working and determine if your heart muscle is getting enough blood. You will exercise on a treadmill to naturally increase or stress the functioning of your heart.  An echocardiogram uses sound waves (ultrasound) to produce an image of your heart. If your heart does not work normally, it may indicate coronary artery disease with poor coronary blood supply. The coronary arteries are the arteries that bring blood and oxygen to your heart. LET [facility] CARE PROVIDER KNOW ABOUT:  Any allergies you have.  All medicines you are taking, including vitamins, herbs, eye drops, creams, and over-the-counter medicines.  Previous problems you or members of your family have had with the use of anesthetics.  Any blood disorders you have.  Previous surgeries you have had.  Medical conditions you have.  Possibility of pregnancy, if this applies. RISKS AND COMPLICATIONS Generally, this is a safe procedure. However, as with any procedure, complications can occur. Possible complications can include:  You develop pain or pressure in the following areas:  Chest.  Jaw or neck.  Between your shoulder blades.  Radiating down your left arm.  Dizziness or lightheadedness.  Shortness of breath.  Increased or irregular heartbeat.  Nausea or vomiting.  Heart attack (rare). BEFORE THE PROCEDURE  Avoid all forms of caffeine for 24 hours before your test or as directed by your health care provider. This includes coffee, tea (even decaffeinated tea), caffeinated sodas, chocolate, cocoa, and certain pain medicines.  Follow your health care provider's instructions regarding eating and drinking  before the test.  Take your medicines as directed at regular times with water unless instructed otherwise. Exceptions may include:  If you have diabetes, ask how you are to take your insulin or pills. It is common to adjust insulin dosing the morning of the test.  If you are taking beta-blocker medicines, it is important to talk to your health care provider about these medicines well before the date of your test. Taking beta-blocker medicines may interfere with the test. In some cases, these medicines need to be changed or stopped 24 hours or more before the test.  If you wear a nitroglycerin patch, it may need to be removed prior to the test. Ask your health care provider if the patch should be removed before the test.  If you use an inhaler for any breathing condition, bring it with you to the test.  If you are an outpatient, bring a snack so you can eat right after the stress phase of the test.  Do not smoke for 4 hours prior to the test or as directed by your health care provider.  Wear loose-fitting clothes and comfortable shoes for the test. This test involves walking on a treadmill. PROCEDURE   Multiple electrodes will be put on your chest. If needed, small areas of your chest may be shaved to get better contact with the electrodes. Once the electrodes are attached to your body, multiple wires will be attached to the electrodes, and your heart rate will be monitored.  You will have an echocardiogram done at rest.  To produce this image of your heart, gel is applied to your chest, and a wand-like tool (transducer) is moved over the chest. The transducer sends the sound waves through the chest to create the moving images of your  heart.  You may need an IV to receive a medication that improves the quality of the pictures.  You will then walk on a treadmill. The treadmill will be started at a slow pace. The treadmill speed and incline will gradually be increased to raise your heart  rate.  At the peak of exercise, the treadmill will be stopped. You will lie down immediately on a bed so that a second echocardiogram can be done to visualize your heart's motion with exercise.  The test usually takes 30-60 minutes to complete. AFTER THE PROCEDURE  Your heart rate and blood pressure will be monitored after the test.  You may return to your normal schedule, including diet, activities, and medicines, unless your health care provider tells you otherwise.   This information is not intended to replace advice given to you by your health care provider. Make sure you discuss any questions you have with your health care provider.   Document Released: 10/01/2004 Document Revised: 10/02/2013 Document Reviewed: 06/04/2013 Elsevier Interactive Patient Education 2016 [employer]   Holter Monitoring A Holter monitor is a small device that is used to detect abnormal heart rhythms. It clips to your clothing and is connected by wires to flat, sticky disks (electrodes) that attach to your chest. It is worn continuously for 24-48 hours. HOME CARE INSTRUCTIONS  Wear your Holter monitor at all times, even while exercising and sleeping, for as long as directed by your health care provider.  Make sure that the Holter monitor is safely clipped to your clothing or close to your body as recommended by your health care provider.  Do not get the monitor or wires wet.  Do not put body lotion or moisturizer on your chest.  Keep your skin clean.  Keep a diary of your daily activities, such as walking and doing chores. If you feel that your heartbeat is abnormal or that your heart is fluttering or skipping a beat:  Record what you are doing when it happens.  Record what time of day the symptoms occur.  Return your Holter monitor as directed by your health care provider.  Keep all follow-up visits as directed by your health care provider. This is important. SEEK IMMEDIATE MEDICAL CARE  IF:  You feel lightheaded or you faint.  You have trouble breathing.  You feel pain in your chest, upper arm, or jaw.  You feel sick to your stomach and your skin is pale, cool, or damp.  You heartbeat feels unusual or abnormal.   This information is not intended to replace advice given to you by your health care provider. Make sure you discuss any questions you have with your health care provider.   Document Released: 06/25/2004 Document Revised: 10/18/2014 Document Reviewed: 05/06/2014 Elsevier Interactive Patient Education [employer].

## 2016-02-11 NOTE — Progress Notes (Signed)
Cardiology Office Note   Date:  02/11/2016   ID:  Jacqueline Craig, DOB Sep 12, 1986, MRN [id number]  Referring Doctor:  Tommi Rumps, MD   Cardiologist:   Wende Bushy, MD   Reason for consultation:  Chief Complaint  Patient presents with  . other    ref by Dr. Caryl Bis for palpiations and abnormal EKG. Meds reviewed by the patient verbally.       History of Present Illness: Jacqueline Craig is a 30 y.o. female who presents for Palpitations and chest pain.  Palpitations of been going on for several months now. According to her mother, she may have complained of palpitations even in her younger years beginning her teens to her 57s. Symptoms are located mainly in the chest. She feels her heart going fast, and pounding. Moderate in intensity. Episodes lasted minutes at a time. It happened on a daily basis. She also have nighttime episodes, several times in a month, not on a nightly basis. She thinks her symptoms are worse with the higher caffeine intake especially when she is at 3-4 cups in a day. Also more prominent with sleep deprivation.  Also reports an episode of chest pain. This happened a month ago. This was during the time that grandfather passed. She woke up at night from sleep with a sharp pain in her chest, nonradiating, 4 out of 10 severity lasted minutes. Eventually resolved spontaneously.  Patient denies orthopnea, PND, edema. No fever, cough, colds. She brings up the issues with that slowly, only sleeping 3-4 hours a night because of having to attend [employer] school.   ROS:  Please see the history of present illness. Aside from mentioned under HPI, all other systems are reviewed and negative.     Past Medical History  Diagnosis Date  . Chickenpox   . GERD (gastroesophageal reflux disease)   . High blood pressure   . UTI (lower urinary tract infection)   . Raynaud phenomenon   . Bilateral bunions     History reviewed. No pertinent past surgical history.   reports that she has quit smoking. She does not have any smokeless tobacco history on file. She reports that she drinks about 4.2 oz of alcohol per week. She reports that she does not use illicit drugs.   family history is not on file. Maternal grandfather had heart disease in his 31s. Maternal grandmother hasa pacemaker.   Current Outpatient Prescriptions  Medication Sig Dispense Refill  . amphetamine-dextroamphetamine (ADDERALL) 20 MG tablet Take 1 tablet (20 mg total) by mouth 2 (two) times daily. Do not fill until 02/08/16 60 tablet 0  . buPROPion (WELLBUTRIN XL) 300 MG 24 hr tablet Take 1 tablet (300 mg total) by mouth daily. 90 tablet 1  . calcium-vitamin D (OSCAL WITH D) 500-200 MG-UNIT tablet Take 1 tablet by mouth.    . co-enzyme Q-10 30 MG capsule Take 10 mg by mouth 3 (three) times daily.    . Cranberry 400 MG TABS Take 300 mg by mouth.    Marland Kitchen glucosamine-chondroitin 500-400 MG tablet Take 1 tablet by mouth 3 (three) times daily.    Marland Kitchen levonorgestrel (PLAN B 1-STEP) 1.5 MG tablet 14 mg by Implant route once.    . Multiple Vitamins-Minerals (MULTIVITAMIN WITH MINERALS) tablet Take 1 tablet by mouth daily.    . pantoprazole (PROTONIX) 40 MG tablet Take 1 tablet (40 mg total) by mouth daily. 90 tablet 1  . valACYclovir (VALTREX) 1000 MG tablet Take 1,000 mg by mouth 2 (two) times  daily.     No current facility-administered medications for this visit.    Allergies: Ciprofloxacin    PHYSICAL EXAM: VS:  BP 110/68 mmHg  Pulse 79  Ht 5' 5.5" (1.664 m)  Wt 130 lb 5 oz (59.109 kg)  BMI 21.35 kg/m2 , Body mass index is 21.35 kg/(m^2). Wt Readings from Last 3 Encounters:  02/11/16 130 lb 5 oz (59.109 kg)  01/08/16 129 lb (58.514 kg)  09/17/15 132 lb 6.4 oz (60.056 kg)    GENERAL:  well developed, well nourished, not in acute distress HEENT: normocephalic, pink conjunctivae, anicteric sclerae, no xanthelasma, normal dentition, oropharynx clear NECK:  no neck vein engorgement, JVP  normal, no hepatojugular reflux, carotid upstroke brisk and symmetric, no bruit, no thyromegaly, no lymphadenopathy LUNGS:  good respiratory effort, clear to auscultation bilaterally CV:  PMI not displaced, no thrills, no lifts, S1 and S2 within normal limits, no palpable S3 or S4, no murmurs, no rubs, no gallops ABD:  Soft, nontender, nondistended, normoactive bowel sounds, no abdominal aortic bruit, no hepatomegaly, no splenomegaly MS: nontender back, no kyphosis, no scoliosis, no joint deformities EXT:  2+ DP/PT pulses, no edema, no varicosities, no cyanosis, no clubbing SKIN: warm, nondiaphoretic, normal turgor, no ulcers NEUROPSYCH: alert, oriented to person, place, and time, sensory/motor grossly intact, normal mood, appropriate affect  Recent Labs: 09/17/2015: Hemoglobin 13.5; Platelets 201.0   Lipid Panel No results found for: CHOL, TRIG, HDL, CHOLHDL, VLDL, LDLCALC, LDLDIRECT   Other studies Reviewed:  EKG:  The ekg from 02/11/2016 was personally reviewed by me and it revealed sinus rhythm, rightward axis, borderline EKG  Additional studies/ records that were reviewed personally reviewed by me today include: None available   ASSESSMENT AND PLAN: Palpitations Recommend 24-hour Holter monitor. Electrolytes and TSH were checked in November 2016 and they were within normal limits. Patient knows to reduce caffeine intake as this affects frequency and intensity of her palpitations. Recommend echocardiogram  Chest pain Atypical in nature. Patient likely needs reassurance with stress echocardiogram.   Current medicines are reviewed at length with the patient today.  The patient does not have concerns regarding medicines.  Labs/ tests ordered today include:  Orders Placed This Encounter  Procedures  . Holter monitor - 24 hour  . EKG 12-Lead  . Echocardiogram  . Echo stress    I had a lengthy and detailed discussion with the patient regarding diagnoses, prognosis,  diagnostic options, treatment options , and side effects of medications.   I counseled the patient on importance of lifestyle modification including heart healthy diet, regular physical activity.   Disposition:   FU with undersigned after tests   Signed, Wende Bushy, MD  02/11/2016 3:10 PM    [place]

## 2016-02-27 ENCOUNTER — Telehealth: Payer: Self-pay

## 2016-02-27 NOTE — Telephone Encounter (Signed)
Spoke with Harrell Gave (spouse) is aware that Jacqueline Craig needs to report after the Stress Echo to [facility] for the monitor placement.  Harrell Gave is aware of the address to [place] and will make sure the patient reports to [facility] after the Stress Echo here.

## 2016-03-01 ENCOUNTER — Ambulatory Visit: Payer: BLUE CROSS/BLUE SHIELD

## 2016-03-01 ENCOUNTER — Other Ambulatory Visit: Payer: Self-pay

## 2016-03-01 ENCOUNTER — Ambulatory Visit (INDEPENDENT_AMBULATORY_CARE_PROVIDER_SITE_OTHER): Payer: BLUE CROSS/BLUE SHIELD

## 2016-03-01 ENCOUNTER — Encounter: Payer: Self-pay | Admitting: Family Medicine

## 2016-03-01 DIAGNOSIS — R002 Palpitations: Secondary | ICD-10-CM | POA: Diagnosis not present

## 2016-03-01 DIAGNOSIS — R079 Chest pain, unspecified: Secondary | ICD-10-CM

## 2016-03-01 LAB — ECHOCARDIOGRAM STRESS TEST
CHL CUP RESTING HR STRESS: 88 {beats}/min
CHL CUP STRESS STAGE 1 DBP: 86 mmHg
CHL CUP STRESS STAGE 1 GRADE: 0 %
CHL CUP STRESS STAGE 1 HR: 96 {beats}/min
CHL CUP STRESS STAGE 1 SBP: 135 mmHg
CHL CUP STRESS STAGE 10 HR: 144 {beats}/min
CHL CUP STRESS STAGE 11 GRADE: 0 %
CHL CUP STRESS STAGE 11 HR: 100 {beats}/min
CHL CUP STRESS STAGE 11 SPEED: 0 mph
CHL CUP STRESS STAGE 3 GRADE: 0 %
CHL CUP STRESS STAGE 3 SPEED: 1 mph
CHL CUP STRESS STAGE 4 GRADE: 0 %
CHL CUP STRESS STAGE 4 HR: 95 {beats}/min
CHL CUP STRESS STAGE 4 SPEED: 1 mph
CHL CUP STRESS STAGE 5 DBP: 77 mmHg
CHL CUP STRESS STAGE 5 GRADE: 10 %
CHL CUP STRESS STAGE 6 DBP: 72 mmHg
CHL CUP STRESS STAGE 6 SBP: 144 mmHg
CHL CUP STRESS STAGE 6 SPEED: 2.5 mph
CHL CUP STRESS STAGE 7 HR: 155 {beats}/min
CHL CUP STRESS STAGE 7 SPEED: 3.4 mph
CHL CUP STRESS STAGE 8 GRADE: 16 %
CHL CUP STRESS STAGE 8 HR: 173 {beats}/min
CHL CUP STRESS STAGE 8 SPEED: 4.2 mph
CHL CUP STRESS STAGE 9 GRADE: 18 %
CHL CUP STRESS STAGE 9 HR: 187 {beats}/min
CSEPED: 12 min
CSEPEDS: 38 s
CSEPEW: 14.6 METS
CSEPPHR: 187 {beats}/min
MPHR: 189 {beats}/min
Percent HR: 98 %
Percent of predicted max HR: 98 %
RPE: 17
Stage 1 Speed: 0 mph
Stage 10 Grade: 0 %
Stage 10 Speed: 0 mph
Stage 11 DBP: 61 mmHg
Stage 11 SBP: 125 mmHg
Stage 2 Grade: 0 %
Stage 2 HR: 96 {beats}/min
Stage 2 Speed: 0 mph
Stage 3 HR: 96 {beats}/min
Stage 5 HR: 109 {beats}/min
Stage 5 SBP: 126 mmHg
Stage 5 Speed: 1.7 mph
Stage 6 Grade: 12 %
Stage 6 HR: 127 {beats}/min
Stage 7 DBP: 75 mmHg
Stage 7 Grade: 14 %
Stage 7 SBP: 170 mmHg
Stage 8 DBP: 78 mmHg
Stage 8 SBP: 167 mmHg
Stage 9 Speed: 5 mph

## 2016-03-02 ENCOUNTER — Encounter: Payer: Self-pay | Admitting: Cardiology

## 2016-03-02 ENCOUNTER — Ambulatory Visit (INDEPENDENT_AMBULATORY_CARE_PROVIDER_SITE_OTHER): Payer: BLUE CROSS/BLUE SHIELD

## 2016-03-02 DIAGNOSIS — R079 Chest pain, unspecified: Secondary | ICD-10-CM | POA: Diagnosis not present

## 2016-03-02 DIAGNOSIS — R002 Palpitations: Secondary | ICD-10-CM | POA: Diagnosis not present

## 2016-03-17 ENCOUNTER — Telehealth: Payer: Self-pay | Admitting: Family Medicine

## 2016-03-17 ENCOUNTER — Ambulatory Visit: Payer: Self-pay | Admitting: Cardiology

## 2016-03-17 NOTE — Telephone Encounter (Signed)
Spoke with pharmacy and they stated that the RX for this month was only 20 mg take 1 daily and has been filled for taking 2 daily. Informed pharmacy that doctor is not in clinic until Monday. Will speak with Dr. Biagio Quint on Monday about this.

## 2016-03-17 NOTE — Telephone Encounter (Signed)
[facility] called about a question regarding medication amphetamine-dextroamphetamine (ADDERALL) 20 MG tablet. Regarding the directions.   [facility] [phone] [phone]. Thank you!

## 2016-03-22 NOTE — Telephone Encounter (Signed)
Dr Caryl Bis spoke with pharmacy to [facility] for the RX to be 1 20 Mg tablet twice a day

## 2016-04-09 ENCOUNTER — Ambulatory Visit: Payer: Self-pay | Admitting: Family Medicine

## 2016-04-12 ENCOUNTER — Ambulatory Visit (INDEPENDENT_AMBULATORY_CARE_PROVIDER_SITE_OTHER): Payer: BLUE CROSS/BLUE SHIELD | Admitting: Family Medicine

## 2016-04-12 VITALS — BP 102/64 | HR 74 | Temp 98.5°F | Ht 65.0 in | Wt 127.4 lb

## 2016-04-12 DIAGNOSIS — F902 Attention-deficit hyperactivity disorder, combined type: Secondary | ICD-10-CM

## 2016-04-12 DIAGNOSIS — R002 Palpitations: Secondary | ICD-10-CM | POA: Diagnosis not present

## 2016-04-12 DIAGNOSIS — Z8619 Personal history of other infectious and parasitic diseases: Secondary | ICD-10-CM

## 2016-04-12 MED ORDER — VALACYCLOVIR HCL 1 G PO TABS: 2000.0000 mg | ORAL_TABLET | Freq: Two times a day (BID) | ORAL | Status: DC

## 2016-04-12 MED ORDER — BUPROPION HCL ER (XL) 150 MG PO TB24: 150.0000 mg | ORAL_TABLET | Freq: Every day | ORAL | Status: DC

## 2016-04-12 MED ORDER — BUPROPION HCL ER (XL) 300 MG PO TB24: 300.0000 mg | ORAL_TABLET | Freq: Every day | ORAL | Status: DC

## 2016-04-12 MED ORDER — AMPHETAMINE-DEXTROAMPHETAMINE 30 MG PO TABS: 30.0000 mg | ORAL_TABLET | Freq: Two times a day (BID) | ORAL | Status: DC

## 2016-04-12 MED ORDER — AMPHETAMINE-DEXTROAMPHETAMINE 5 MG PO TABS: 5.0000 mg | ORAL_TABLET | Freq: Every day | ORAL | Status: DC

## 2016-04-12 NOTE — Progress Notes (Signed)
Pre visit review using our clinic review tool, if applicable. No additional management support is needed unless otherwise documented below in the visit note. 

## 2016-04-12 NOTE — Patient Instructions (Signed)
Nice to see you. I refilled her Adderall. We will try 30 mg again if she did not notice any difference and had negative cardiac workup. If you notice increasing palpitations with this please let us know we can decrease the dose. If you develop persistent palpitations, chest pain, shortness breath, or any new or changing symptoms please seek medical attention.

## 2016-04-13 ENCOUNTER — Encounter: Payer: Self-pay | Admitting: Family Medicine

## 2016-04-13 DIAGNOSIS — Z8619 Personal history of other infectious and parasitic diseases: Secondary | ICD-10-CM | POA: Insufficient documentation

## 2016-04-13 NOTE — Progress Notes (Signed)
Patient ID: Jacqueline Craig, female   DOB: [birth year], 30 y.o.   MRN: [id number]  Jacqueline Rumps, MD Phone: [phone]  Jacqueline Craig is a 30 y.o. female who presents today for follow-up.  ADHD: Patient notes she has noted quite a difference on the Adderall 20 mg twice daily. Less able to focus appropriately particularly at school. Noted no change in palpitations with change in dose. No weight changes or appetite changes.  Palpitations: Has seen cardiology. Had PVCs and PACs on Holter monitor. Negative stress echo for ischemic disease. Has decreased her caffeine intake. Notes palpitations are about the same. No further workup from a cardiac perspective. States they encouraged her to take better care of herself by getting more sleep.  Cold sores: Occasionally gets these after being stressed out. Gets them on her upper lip. Has previously had Valtrex on hand to treat these. Needs a refill.  PMH: Former smoker ROS see history of present illness  Objective  Physical Exam Filed Vitals:   04/12/16 1604  BP: 102/64  Pulse: 74  Temp: 98.5 F (36.9 C)    BP Readings from Last 3 Encounters:  04/12/16 102/64  02/11/16 110/68  01/08/16 110/70   Wt Readings from Last 3 Encounters:  04/12/16 127 lb 6.4 oz (57.788 kg)  02/11/16 130 lb 5 oz (59.109 kg)  01/08/16 129 lb (58.514 kg)    Physical Exam  Constitutional: She is well-developed, well-nourished, and in no distress.  HENT:  Head: Normocephalic and atraumatic.  Cardiovascular: Normal rate, regular rhythm and normal heart sounds.   Pulmonary/Chest: Effort normal and breath sounds normal.  Neurological: She is alert. Gait normal.  Skin: Skin is warm and dry. She is not diaphoretic.     Assessment/Plan: Please see individual problem list.  H/O cold sores Gets these recurrently with stress. None at this time. We'll provide her with a prescription for Valtrex to take with outbreaks only.  Attention deficit hyperactivity  disorder (ADHD) Slight worsening in ADHD symptoms with decrease in Adderall dose. No significant change in her palpitations which could be related to the PACs and PVCs seen on Holter monitor. Discussed options of staying where she is at or going back up to 30 mg as there was no change in palpitations. She has had an overall unremarkable cardiac workup. We will increase Adderall to 30 mg twice daily with 5 as a booster dose in the afternoon. She'll monitor for increase in palpitations. If this occurs will need to decrease back to 20 mg.  Palpitations Stable. Overall unremarkable cardiac workup. Encouraged increased sleep as it seems that these occur with sleep deprivation. Continue to limit caffeine intake. Monitor for increased palpitations with going back up on her Adderall. Given return precautions.  Patient additionally note she is on Wellbutrin in the past for smoking cessation and was just left on this she states she was doing well. She is interested in discontinuing this. Per review of up-to-date taper requires decreased to 150 mg dosing of Wellbutrin XL for 2 weeks and then discontinuation. She will do this.  No orders of the defined types were placed in this encounter.    Meds ordered this encounter  Medications  . DISCONTD: buPROPion (WELLBUTRIN XL) 300 MG 24 hr tablet    Sig: Take 1 tablet (300 mg total) by mouth daily.    Dispense:  90 tablet    Refill:  1  . amphetamine-dextroamphetamine (ADDERALL) 30 MG tablet    Sig: Take 1 tablet by mouth 2 (two) times  daily.    Dispense:  60 tablet    Refill:  00  . amphetamine-dextroamphetamine (ADDERALL) 30 MG tablet    Sig: Take 1 tablet by mouth 2 (two) times daily. Do not fill until 06/13/16    Dispense:  60 tablet    Refill:  0  . amphetamine-dextroamphetamine (ADDERALL) 30 MG tablet    Sig: Take 1 tablet by mouth 2 (two) times daily. Do not fill until 05/13/16    Dispense:  60 tablet    Refill:  0  . amphetamine-dextroamphetamine  (ADDERALL) 5 MG tablet    Sig: Take 1 tablet (5 mg total) by mouth daily.    Dispense:  30 tablet    Refill:  0  . amphetamine-dextroamphetamine (ADDERALL) 5 MG tablet    Sig: Take 1 tablet (5 mg total) by mouth daily. Do not fill until 05/13/16    Dispense:  30 tablet    Refill:  0  . amphetamine-dextroamphetamine (ADDERALL) 5 MG tablet    Sig: Take 1 tablet (5 mg total) by mouth daily. Do not fill until 06/13/16    Dispense:  30 tablet    Refill:  0  . valACYclovir (VALTREX) 1000 MG tablet    Sig: Take 2 tablets (2,000 mg total) by mouth 2 (two) times daily. For one day at first sign of lesions.    Dispense:  10 tablet    Refill:  0  . buPROPion (WELLBUTRIN XL) 150 MG 24 hr tablet    Sig: Take 1 tablet (150 mg total) by mouth daily.    Dispense:  30 tablet    Refill:  0    Jacqueline Rumps, MD [place]

## 2016-04-13 NOTE — Assessment & Plan Note (Addendum)
Gets these recurrently with stress. None at this time. We'll provide her with a prescription for Valtrex to take with outbreaks only.

## 2016-04-13 NOTE — Assessment & Plan Note (Signed)
Stable. Overall unremarkable cardiac workup. Encouraged increased sleep as it seems that these occur with sleep deprivation. Continue to limit caffeine intake. Monitor for increased palpitations with going back up on her Adderall. Given return precautions.

## 2016-04-13 NOTE — Assessment & Plan Note (Signed)
Slight worsening in ADHD symptoms with decrease in Adderall dose. No significant change in her palpitations which could be related to the PACs and PVCs seen on Holter monitor. Discussed options of staying where she is at or going back up to 30 mg as there was no change in palpitations. She has had an overall unremarkable cardiac workup. We will increase Adderall to 30 mg twice daily with 5 as a booster dose in the afternoon. She'll monitor for increase in palpitations. If this occurs will need to decrease back to 20 mg.

## 2016-05-06 ENCOUNTER — Telehealth: Payer: Self-pay | Admitting: Family Medicine

## 2016-05-06 DIAGNOSIS — Z789 Other specified health status: Secondary | ICD-10-CM

## 2016-05-06 NOTE — Telephone Encounter (Signed)
Patient had a dose in February, no follow up doses, please advise, start the series again?

## 2016-05-06 NOTE — Telephone Encounter (Signed)
Pt called about needing a Hep B Titer. Pt has not checked with ins but she says she needs it for her job. Is it ok to sch?  Call pt @ [phone]. Thank you!

## 2016-05-07 ENCOUNTER — Telehealth: Payer: Self-pay

## 2016-05-07 ENCOUNTER — Other Ambulatory Visit (INDEPENDENT_AMBULATORY_CARE_PROVIDER_SITE_OTHER): Payer: BLUE CROSS/BLUE SHIELD

## 2016-05-07 DIAGNOSIS — Z789 Other specified health status: Secondary | ICD-10-CM

## 2016-05-07 NOTE — Telephone Encounter (Signed)
Looks like pt may be coming for hep b titer today. Please place future order. Thank you.

## 2016-05-08 LAB — HEPATITIS B CORE ANTIBODY, TOTAL: Hep B Core Total Ab: NONREACTIVE

## 2016-05-08 LAB — HEPATITIS B SURFACE ANTIBODY,QUALITATIVE: HEP B S AB: POSITIVE — AB

## 2016-05-08 LAB — HEPATITIS B SURFACE ANTIGEN: Hepatitis B Surface Ag: NEGATIVE

## 2016-05-10 ENCOUNTER — Encounter: Payer: Self-pay | Admitting: Family Medicine

## 2016-05-12 ENCOUNTER — Other Ambulatory Visit: Payer: Self-pay | Admitting: Family Medicine

## 2016-05-12 MED ORDER — BUPROPION HCL ER (XL) 300 MG PO TB24: 300.0000 mg | ORAL_TABLET | Freq: Every day | ORAL | 1 refills | Status: DC

## 2016-07-14 ENCOUNTER — Ambulatory Visit (INDEPENDENT_AMBULATORY_CARE_PROVIDER_SITE_OTHER): Payer: BLUE CROSS/BLUE SHIELD | Admitting: Family Medicine

## 2016-07-14 ENCOUNTER — Encounter: Payer: Self-pay | Admitting: Family Medicine

## 2016-07-14 VITALS — BP 110/70 | HR 82 | Temp 98.0°F | Wt 123.0 lb

## 2016-07-14 DIAGNOSIS — R1013 Epigastric pain: Secondary | ICD-10-CM

## 2016-07-14 DIAGNOSIS — Z8719 Personal history of other diseases of the digestive system: Secondary | ICD-10-CM

## 2016-07-14 DIAGNOSIS — F902 Attention-deficit hyperactivity disorder, combined type: Secondary | ICD-10-CM | POA: Diagnosis not present

## 2016-07-14 MED ORDER — AMPHETAMINE-DEXTROAMPHETAMINE 30 MG PO TABS: 30.0000 mg | ORAL_TABLET | Freq: Two times a day (BID) | ORAL | 0 refills | Status: DC

## 2016-07-14 MED ORDER — AMPHETAMINE-DEXTROAMPHETAMINE 5 MG PO TABS: 5.0000 mg | ORAL_TABLET | Freq: Every day | ORAL | 0 refills | Status: DC

## 2016-07-14 MED ORDER — AMPHETAMINE-DEXTROAMPHETAMINE 30 MG PO TABS: 30.0000 mg | ORAL_TABLET | Freq: Two times a day (BID) | ORAL | Status: DC

## 2016-07-14 NOTE — Assessment & Plan Note (Signed)
Has intermittent symptoms. Reports somebody at her [employer] school felt an abnormality in her right upper quadrant. I do not appreciate anything abnormal there. We will obtain a right upper quadrant ultrasound to evaluate further. She'll continue PPI. Given return precautions.

## 2016-07-14 NOTE — Progress Notes (Signed)
Tommi Rumps, MD Phone: [phone]  Jacqueline Craig is a 30 y.o. female who presents today for follow-up.  ADHD: Patient notes she takes her Adderall mostly during the week. Takes a break most weekends. Has had some mild weight loss though she thinks this is related to stress from being in [place] school. She notes no appetite suppression. No palpitations.  Gastritis: Patient has been dealing with this for the last couple of years. She had an EGD that she reports was okay. She's been on a PPI. She states she had an exam at school yesterday where somebody told her they felt something abnormal in her right upper quadrant. She notes no pain associated with eating. No reflux. Prior LFTs in the last year been normal.  ROS see history of present illness  Objective  Physical Exam Vitals:   07/14/16 1409  BP: 110/70  Pulse: 82  Temp: 98 F (36.7 C)    BP Readings from Last 3 Encounters:  07/14/16 110/70  04/12/16 102/64  02/11/16 110/68   Wt Readings from Last 3 Encounters:  07/14/16 123 lb (55.8 kg)  04/12/16 127 lb 6.4 oz (57.8 kg)  02/11/16 130 lb 5 oz (59.1 kg)    Physical Exam  Constitutional: She is well-developed, well-nourished, and in no distress.  Cardiovascular: Normal rate, regular rhythm and normal heart sounds.   Pulmonary/Chest: Effort normal and breath sounds normal.  Abdominal: Soft. Bowel sounds are normal. She exhibits no distension and no mass. There is no tenderness. There is no rebound and no guarding.  Neurological: She is alert. Gait normal.  Skin: Skin is warm and dry.     Assessment/Plan: Please see individual problem list.  Attention deficit hyperactivity disorder (ADHD) Stable. Given refills. Follow-up in 3 months.  History of gastritis Has intermittent symptoms. Reports somebody at her [employer] school felt an abnormality in her right upper quadrant. I do not appreciate anything abnormal there. We will obtain a right upper quadrant ultrasound to  evaluate further. She'll continue PPI. Given return precautions.   Orders Placed This Encounter  Procedures  . US Abdomen Limited RUQ    Standing Status:   Future    Standing Expiration Date:   09/13/2017    Order Specific Question:   Reason for Exam (SYMPTOM  OR DIAGNOSIS REQUIRED)    Answer:   chronic epigastric uncomfortable feeling, reports of exam with abnormality in RUQ    Order Specific Question:   Preferred imaging location?    Answer:   [facility] ordered this encounter  Medications  . Levonorgestrel (SKYLA) 13.5 MG IUD    Sig: by Intrauterine route.  Marland Kitchen amphetamine-dextroamphetamine (ADDERALL) 30 MG tablet    Sig: Take 1 tablet by mouth 2 (two) times daily.    Dispense:  60 tablet    Refill:  00  . amphetamine-dextroamphetamine (ADDERALL) 30 MG tablet    Sig: Take 1 tablet by mouth 2 (two) times daily. Do not fill until 08/14/16    Dispense:  60 tablet    Refill:  0  . amphetamine-dextroamphetamine (ADDERALL) 30 MG tablet    Sig: Take 1 tablet by mouth 2 (two) times daily. Do not fill until 09/13/16    Dispense:  60 tablet    Refill:  0  . amphetamine-dextroamphetamine (ADDERALL) 5 MG tablet    Sig: Take 1 tablet (5 mg total) by mouth daily.    Dispense:  30 tablet    Refill:  0  . amphetamine-dextroamphetamine (ADDERALL) 5  MG tablet    Sig: Take 1 tablet (5 mg total) by mouth daily. Do not fill until 09/13/16    Dispense:  30 tablet    Refill:  0  . amphetamine-dextroamphetamine (ADDERALL) 5 MG tablet    Sig: Take 1 tablet (5 mg total) by mouth daily. Do not fill until 08/14/16    Dispense:  30 tablet    Refill:  0    Tommi Rumps, MD [place]

## 2016-07-14 NOTE — Patient Instructions (Signed)
Nice to see you. We will get an ultrasound of your gallbladder. We will refill your Adderall.  If you develop abdominal pain, or any new or changing symptoms please seek medical attention.

## 2016-07-14 NOTE — Assessment & Plan Note (Signed)
Stable. Given refills. Follow-up in 3 months.

## 2016-07-21 ENCOUNTER — Ambulatory Visit: Payer: BLUE CROSS/BLUE SHIELD

## 2016-07-22 ENCOUNTER — Ambulatory Visit
Admission: RE | Admit: 2016-07-22 | Discharge: 2016-07-22 | Disposition: A | Discharge status: Home / Self Care | Payer: BLUE CROSS/BLUE SHIELD | Source: Ambulatory Visit | Attending: Family Medicine | Admitting: Family Medicine

## 2016-07-22 DIAGNOSIS — R1013 Epigastric pain: Secondary | ICD-10-CM | POA: Diagnosis not present

## 2016-08-05 ENCOUNTER — Other Ambulatory Visit: Payer: Self-pay | Admitting: Family Medicine

## 2016-08-05 DIAGNOSIS — R1011 Right upper quadrant pain: Secondary | ICD-10-CM

## 2016-08-06 ENCOUNTER — Telehealth: Payer: Self-pay | Admitting: *Deleted

## 2016-08-06 NOTE — Telephone Encounter (Signed)
Pt requested TB skin test and reading cost  Pt contact [phone]

## 2016-08-12 ENCOUNTER — Ambulatory Visit: Admission: RE | Admit: 2016-08-12 | Payer: BLUE CROSS/BLUE SHIELD | Source: Ambulatory Visit

## 2016-09-28 ENCOUNTER — Ambulatory Visit: Payer: Self-pay | Admitting: Family Medicine

## 2016-09-29 ENCOUNTER — Ambulatory Visit (INDEPENDENT_AMBULATORY_CARE_PROVIDER_SITE_OTHER): Payer: BLUE CROSS/BLUE SHIELD | Admitting: Family Medicine

## 2016-09-29 ENCOUNTER — Ambulatory Visit: Payer: Self-pay | Admitting: Family Medicine

## 2016-09-29 ENCOUNTER — Encounter: Payer: Self-pay | Admitting: Family Medicine

## 2016-09-29 VITALS — BP 108/78 | HR 86 | Temp 98.7°F | Wt 123.2 lb

## 2016-09-29 DIAGNOSIS — F902 Attention-deficit hyperactivity disorder, combined type: Secondary | ICD-10-CM | POA: Diagnosis not present

## 2016-09-29 DIAGNOSIS — R1011 Right upper quadrant pain: Secondary | ICD-10-CM

## 2016-09-29 LAB — COMPREHENSIVE METABOLIC PANEL
ALT: 18 U/L (ref 0–35)
AST: 22 U/L (ref 0–37)
Albumin: 4.7 g/dL (ref 3.5–5.2)
Alkaline Phosphatase: 59 U/L (ref 39–117)
BUN: 12 mg/dL (ref 6–23)
CALCIUM: 9.7 mg/dL (ref 8.4–10.5)
CHLORIDE: 106 meq/L (ref 96–112)
CO2: 26 meq/L (ref 19–32)
CREATININE: 0.88 mg/dL (ref 0.40–1.20)
GFR: 78.94 mL/min (ref >60.00)
Glucose, Bld: 106 mg/dL — ABNORMAL HIGH (ref 70–99)
POTASSIUM: 4.1 meq/L (ref 3.5–5.1)
Sodium: 140 mEq/L (ref 135–145)
Total Bilirubin: 0.6 mg/dL (ref 0.2–1.2)
Total Protein: 7.1 g/dL (ref 6.0–8.3)

## 2016-09-29 LAB — POCT URINE PREGNANCY: Preg Test, Ur: NEGATIVE

## 2016-09-29 MED ORDER — AMPHETAMINE-DEXTROAMPHETAMINE 5 MG PO TABS: 5.0000 mg | ORAL_TABLET | Freq: Every day | ORAL | 0 refills | Status: DC

## 2016-09-29 MED ORDER — AMPHETAMINE-DEXTROAMPHETAMINE 30 MG PO TABS: 30.0000 mg | ORAL_TABLET | Freq: Two times a day (BID) | ORAL | 0 refills | Status: DC

## 2016-09-29 MED ORDER — AMPHETAMINE-DEXTROAMPHETAMINE 30 MG PO TABS
30.0000 mg | ORAL_TABLET | Freq: Two times a day (BID) | ORAL | 0 refills | Status: DC
Start: 2016-09-29 — End: 2016-12-28

## 2016-09-29 MED ORDER — AMPHETAMINE-DEXTROAMPHETAMINE 30 MG PO TABS
30.0000 mg | ORAL_TABLET | Freq: Two times a day (BID) | ORAL | Status: DC
Start: 2016-09-29 — End: 2016-09-29

## 2016-09-29 MED ORDER — AMPHETAMINE-DEXTROAMPHETAMINE 30 MG PO TABS
30.0000 mg | ORAL_TABLET | Freq: Two times a day (BID) | ORAL | Status: DC
Start: 2016-09-29 — End: 2016-12-28

## 2016-09-29 NOTE — Progress Notes (Signed)
Pre visit review using our clinic review tool, if applicable. No additional management support is needed unless otherwise documented below in the visit note. 

## 2016-09-29 NOTE — Progress Notes (Signed)
  Tommi Rumps, MD Phone: [phone]  Jacqueline Craig is a 30 y.o. female who presents today for f/u.  ADHD: stable on adderall. Takes most days. Is beneficial. No appetite changes. No weight changes. No palpitations.  Continues to have issues with right upper quadrant. Notes it is a sore discomfort. Occurred once after drinking alcohol. Does drink a lot of coffee. Takes a while to go away at times. Does not occur with any other specific foods or drinks. She had a right upper quadrant ultrasound that was unrevealing. Did not proceed with a HIDA scan. She does have mild soreness at this time.  ROS see history of present illness  Objective  Physical Exam Vitals:   09/29/16 1330  BP: 108/78  Pulse: 86  Temp: 98.7 F (37.1 C)    BP Readings from Last 3 Encounters:  09/29/16 108/78  07/14/16 110/70  04/12/16 102/64   Wt Readings from Last 3 Encounters:  09/29/16 123 lb 3.2 oz (55.9 kg)  07/14/16 123 lb (55.8 kg)  04/12/16 127 lb 6.4 oz (57.8 kg)    Physical Exam  Constitutional: No distress.  Cardiovascular: Normal rate, regular rhythm and normal heart sounds.   Pulmonary/Chest: Effort normal and breath sounds normal.  Abdominal: Soft. Bowel sounds are normal. She exhibits no distension. There is no tenderness. There is no rebound and no guarding.  Neurological: She is alert. Gait normal.  Skin: She is not diaphoretic.     Assessment/Plan: Please see individual problem list.  Attention deficit hyperactivity disorder (ADHD) Stable on Adderall. Refills provided. Follow-up in 3 months.  Right upper quadrant pain Patient with continued chronic intermittent right upper quadrant symptoms. Described as a soreness. Mild discomfort on exam today. Urine pregnancy test negative. We'll check CMP. If any elevation to LFTs would consider HIDA scan. If normal could consider HIDA scan as well. Given return precautions.   Orders Placed This Encounter  Procedures  . Comp Met  (CMET)  . POCT urine pregnancy    Tommi Rumps, MD [place]

## 2016-09-29 NOTE — Assessment & Plan Note (Signed)
Patient with continued chronic intermittent right upper quadrant symptoms. Described as a soreness. Mild discomfort on exam today. Urine pregnancy test negative. We'll check CMP. If any elevation to LFTs would consider HIDA scan. If normal could consider HIDA scan as well. Given return precautions.

## 2016-09-29 NOTE — Assessment & Plan Note (Signed)
Stable on Adderall. Refills provided. Follow-up in 3 months. 

## 2016-09-29 NOTE — Patient Instructions (Signed)
Nice to see you. We will refill your adderal. We will obtain lab work to evaluate your liver and gall bladder.  Please consider proceeding with the HIDA scan.

## 2016-10-01 ENCOUNTER — Other Ambulatory Visit: Payer: Self-pay | Admitting: Family Medicine

## 2016-10-01 DIAGNOSIS — R1011 Right upper quadrant pain: Secondary | ICD-10-CM

## 2016-10-06 ENCOUNTER — Encounter: Payer: Self-pay | Admitting: Family Medicine

## 2016-10-07 ENCOUNTER — Ambulatory Visit: Payer: BLUE CROSS/BLUE SHIELD

## 2016-10-15 ENCOUNTER — Telehealth: Payer: Self-pay | Admitting: *Deleted

## 2016-10-15 DIAGNOSIS — Z5181 Encounter for therapeutic drug level monitoring: Secondary | ICD-10-CM

## 2016-10-15 NOTE — Telephone Encounter (Signed)
[facility] stated that they have been trying to reach pt to have a urine pregnancy test completed today. Pt has an appt with nuclear [facility] on 10/18/16 ,prior to this appt a urine pregnancy has to be ordered and completed today. Patient could not be reached by [facility]  Contact [facility] [phone]

## 2016-10-15 NOTE — Telephone Encounter (Signed)
This has been ordered. Please try to contact the patient again. You can call and send a mychart message. Thanks.

## 2016-10-15 NOTE — Telephone Encounter (Signed)
Left message to return call, can we order this?

## 2016-10-15 NOTE — Telephone Encounter (Signed)
mychart message sent

## 2016-10-18 ENCOUNTER — Encounter: Admission: RE | Admit: 2016-10-18 | Payer: BLUE CROSS/BLUE SHIELD | Source: Ambulatory Visit

## 2016-10-18 NOTE — Telephone Encounter (Signed)
Unable to reach patient per phone and mychart

## 2016-10-18 NOTE — Telephone Encounter (Signed)
Left message to return call, unable to reach patient. Will send a letter.

## 2016-10-18 NOTE — Telephone Encounter (Signed)
Noted. Please contact the patient to see if she would like to proceed with this study. Thanks.

## 2016-10-18 NOTE — Telephone Encounter (Signed)
Per [facility], patient no showed scan

## 2016-10-26 ENCOUNTER — Encounter
Admission: RE | Admit: 2016-10-26 | Discharge: 2016-10-26 | Disposition: A | Discharge status: Home / Self Care | Payer: BLUE CROSS/BLUE SHIELD | Source: Ambulatory Visit | Attending: Family Medicine | Admitting: Family Medicine

## 2016-10-26 ENCOUNTER — Encounter: Payer: Self-pay | Admitting: Family Medicine

## 2016-10-26 ENCOUNTER — Other Ambulatory Visit
Admission: RE | Admit: 2016-10-26 | Discharge: 2016-10-26 | Disposition: A | Discharge status: Home / Self Care | Payer: BLUE CROSS/BLUE SHIELD | Source: Ambulatory Visit | Attending: Family Medicine | Admitting: Family Medicine

## 2016-10-26 DIAGNOSIS — R1011 Right upper quadrant pain: Secondary | ICD-10-CM | POA: Diagnosis not present

## 2016-10-26 LAB — PREGNANCY, URINE: PREG TEST UR: NEGATIVE

## 2016-10-26 MED ORDER — TECHNETIUM TC 99M MEBROFENIN IV KIT
5.0000 | PACK | Freq: Once | INTRAVENOUS | Status: AC | PRN
  Administered 2016-10-26: 5.35 via INTRAVENOUS

## 2016-11-06 ENCOUNTER — Other Ambulatory Visit: Payer: Self-pay | Admitting: Family Medicine

## 2016-11-08 ENCOUNTER — Other Ambulatory Visit: Payer: Self-pay | Admitting: Family Medicine

## 2016-11-08 MED ORDER — VALACYCLOVIR HCL 1 G PO TABS: 2000.0000 mg | ORAL_TABLET | Freq: Two times a day (BID) | ORAL | 0 refills | Status: DC

## 2016-11-08 NOTE — Telephone Encounter (Signed)
Last filled 04/12/16 10 0rf

## 2016-11-22 ENCOUNTER — Other Ambulatory Visit: Payer: Self-pay | Admitting: Family Medicine

## 2016-11-29 ENCOUNTER — Encounter: Payer: Self-pay | Admitting: Physician Assistant

## 2016-11-29 ENCOUNTER — Ambulatory Visit (INDEPENDENT_AMBULATORY_CARE_PROVIDER_SITE_OTHER): Payer: BLUE CROSS/BLUE SHIELD | Admitting: Physician Assistant

## 2016-11-29 VITALS — BP 108/68 | HR 91 | Temp 99.2°F | Ht 65.0 in | Wt 123.0 lb

## 2016-11-29 DIAGNOSIS — J111 Influenza due to unidentified influenza virus with other respiratory manifestations: Secondary | ICD-10-CM

## 2016-11-29 MED ORDER — VALACYCLOVIR HCL 1 G PO TABS: 2000.0000 mg | ORAL_TABLET | Freq: Two times a day (BID) | ORAL | 0 refills | Status: DC

## 2016-11-29 MED ORDER — OSELTAMIVIR PHOSPHATE 75 MG PO CAPS: 75.0000 mg | ORAL_CAPSULE | Freq: Two times a day (BID) | ORAL | 0 refills | Status: DC

## 2016-11-29 MED ORDER — BENZONATATE 200 MG PO CAPS: 200.0000 mg | ORAL_CAPSULE | Freq: Two times a day (BID) | ORAL | 0 refills | Status: DC | PRN

## 2016-11-29 NOTE — Progress Notes (Signed)
Pre visit review using our clinic review tool, if applicable. No additional management support is needed unless otherwise documented below in the visit note. 

## 2016-11-29 NOTE — Patient Instructions (Signed)
It was great meeting you today.  Take the Tessalon as needed for cough.  Increase fluids.  Rest.   Take the Tamiflu as prescribed. Call or return to clinic if symptoms are not improving, especially if worsening fever or shortness of breath.   Influenza, Adult Influenza, more commonly known as "the flu," is a viral infection that primarily affects the respiratory tract. The respiratory tract includes organs that help you breathe, such as the lungs, nose, and throat. The flu causes many common cold symptoms, as well as a high fever and body aches. The flu spreads easily from person to person (is contagious). Getting a flu shot (influenza vaccination) every year is the best way to prevent influenza. What are the causes? Influenza is caused by a virus. You can catch the virus by:  Breathing in droplets from an infected person's cough or sneeze.  Touching something that was recently contaminated with the virus and then touching your mouth, nose, or eyes. What increases the risk? The following factors may make you more likely to get the flu:  Not cleaning your hands frequently with soap and water or alcohol-based hand sanitizer.  Having close contact with many people during cold and flu season.  Touching your mouth, eyes, or nose without washing or sanitizing your hands first.  Not drinking enough fluids or not eating a healthy diet.  Not getting enough sleep or exercise.  Being under a high amount of stress.  Not getting a yearly (annual) flu shot. You may be at a higher risk of complications from the flu, such as a severe lung infection (pneumonia), if you:  Are over the age of 38.  Are pregnant.  Have a weakened disease-fighting system (immune system). You may have a weakened immune system if you:  Have HIV or AIDS.  Are undergoing chemotherapy.  Aretaking medicines that reduce the activity of (suppress) the immune system.  Have a long-term (chronic) illness, such as heart  disease, kidney disease, diabetes, or lung disease.  Have a liver disorder.  Are obese.  Have anemia. What are the signs or symptoms? Symptoms of this condition typically last 4-10 days and may include:  Fever.  Chills.  Headache, body aches, or muscle aches.  Sore throat.  Cough.  Runny or congested nose.  Chest discomfort and cough.  Poor appetite.  Weakness or tiredness (fatigue).  Dizziness.  Nausea or vomiting. How is this diagnosed? This condition may be diagnosed based on your medical history and a physical exam. Your health care provider may do a nose or throat swab test to confirm the diagnosis. How is this treated? If influenza is detected early, you can be treated with antiviral medicine that can reduce the length of your illness and the severity of your symptoms. This medicine may be given by mouth (orally) or through an IV tube that is inserted in one of your veins. The goal of treatment is to relieve symptoms by taking care of yourself at home. This may include taking over-the-counter medicines, drinking plenty of fluids, and adding humidity to the air in your home. In some cases, influenza goes away on its own. Severe influenza or complications from influenza may be treated in a hospital. Follow these instructions at home:  Take over-the-counter and prescription medicines only as told by your health care provider.  Use a cool mist humidifier to add humidity to the air in your home. This can make breathing easier.  Rest as needed.  Drink enough fluid to keep  your urine clear or pale yellow.  Cover your mouth and nose when you cough or sneeze.  Wash your hands with soap and water often, especially after you cough or sneeze. If soap and water are not available, use hand sanitizer.  Stay home from work or school as told by your health care provider. Unless you are visiting your health care provider, try to avoid leaving home until your fever has been gone  for 24 hours without the use of medicine.  Keep all follow-up visits as told by your health care provider. This is important. How is this prevented?  Getting an annual flu shot is the best way to avoid getting the flu. You may get the flu shot in late summer, fall, or winter. Ask your health care provider when you should get your flu shot.  Wash your hands often or use hand sanitizer often.  Avoid contact with people who are sick during cold and flu season.  Eat a healthy diet, drink plenty of fluids, get enough sleep, and exercise regularly. Contact a health care provider if:  You develop new symptoms.  You have:  Chest pain.  Diarrhea.  A fever.  Your cough gets worse.  You produce more mucus.  You feel nauseous or you vomit. Get help right away if:  You develop shortness of breath or difficulty breathing.  Your skin or nails turn a bluish color.  You have severe pain or stiffness in your neck.  You develop a sudden headache or sudden pain in your face or ear.  You cannot stop vomiting. This information is not intended to replace advice given to you by your health care provider. Make sure you discuss any questions you have with your health care provider. Document Released: 09/24/2000 Document Revised: 03/04/2016 Document Reviewed: 07/22/2015 Elsevier Interactive Patient Education  2017 [employer].

## 2016-11-29 NOTE — Telephone Encounter (Signed)
Last filled 11/08/16 10 0rf

## 2016-11-29 NOTE — Progress Notes (Signed)
Subjective:    Patient ID: Jacqueline Craig, female    DOB: 1986/09/22, 31 y.o.   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 On Sunday, she developed headache, cough with green/brown mucus, chest congestion, fever of 100.6-103, fatigue, decreased appetite. Denies chest pain, SOB, recent travel, n/d/v. She did receive a flu shot this year. She is alternating Tylenol and Ibuprofen and taking an over the counter cough medicine with expectorant. She tells me that it has been awhile since she's been sick.  Review of Systems  See HPI  Past Medical History:  Diagnosis Date  . Bilateral bunions   . Chickenpox   . GERD (gastroesophageal reflux disease)   . High blood pressure   . Raynaud phenomenon   . UTI (lower urinary tract infection)      Social History   Social History  . Marital status: Married    Spouse name: N/A  . Number of children: N/A  . Years of education: N/A   Occupational History  . Not on file.   Social History Main Topics  . Smoking status: Former Research scientist (life sciences)  . Smokeless tobacco: Never Used  . Alcohol use 4.2 oz/week    7 Standard drinks or equivalent per week  . Drug use: No  . Sexual activity: Not on file   Other Topics Concern  . Not on file   Social History Narrative  . No narrative on file    No past surgical history on file.  Family History  Problem Relation Age of Onset  . Alcoholism      Parent  . Arthritis      Grandparent  . Prostate cancer      Father  . Hyperlipidemia      Grandparent  . Heart disease      Grandparent  . Stroke      Grandparent  . Hypertension      Grandparent  . Mental illness      Other relative  . Diabetes      Grandparent    Allergies  Allergen Reactions  . Ciprofloxacin     Current Outpatient Prescriptions on File Prior to Visit  Medication Sig Dispense Refill  .  amphetamine-dextroamphetamine (ADDERALL) 30 MG tablet Take 1 tablet by mouth 2 (two) times daily. 60 tablet 00  . amphetamine-dextroamphetamine (ADDERALL) 30 MG tablet Take 1 tablet by mouth 2 (two) times daily. Do not fill until 10/30/16 60 tablet 0  . amphetamine-dextroamphetamine (ADDERALL) 30 MG tablet Take 1 tablet by mouth 2 (two) times daily. Do not fill until 11/30/16 60 tablet 0  . amphetamine-dextroamphetamine (ADDERALL) 5 MG tablet Take 1 tablet (5 mg total) by mouth daily. 30 tablet 0  . amphetamine-dextroamphetamine (ADDERALL) 5 MG tablet Take 1 tablet (5 mg total) by mouth daily. Do not fill until 10/30/16 30 tablet 0  . amphetamine-dextroamphetamine (ADDERALL) 5 MG tablet Take 1 tablet (5 mg total) by mouth daily. Do not fill until 11/30/16 30 tablet 0  . buPROPion (WELLBUTRIN XL) 300 MG 24 hr tablet TAKE 1 TABLET BY MOUTH EVERY DAY 90 tablet 0  . calcium-vitamin D (OSCAL WITH D) 500-200 MG-UNIT tablet Take 1 tablet by mouth.    . co-enzyme Q-10 30 MG capsule Take 10 mg by mouth 3 (three) times daily.    . Cranberry 400 MG TABS Take 300 mg by mouth.    Marland Kitchen  glucosamine-chondroitin 500-400 MG tablet Take 1 tablet by mouth 3 (three) times daily.    Marland Kitchen levonorgestrel (PLAN B 1-STEP) 1.5 MG tablet 14 mg by Implant route once.    . Levonorgestrel (SKYLA) 13.5 MG IUD by Intrauterine route.    . Multiple Vitamins-Minerals (MULTIVITAMIN WITH MINERALS) tablet Take 1 tablet by mouth daily.    . pantoprazole (PROTONIX) 40 MG tablet Take 1 tablet (40 mg total) by mouth daily. 90 tablet 1  . valACYclovir (VALTREX) 1000 MG tablet Take 2 tablets (2,000 mg total) by mouth 2 (two) times daily. For one day at first sign of lesions. 10 tablet 0   No current facility-administered medications on file prior to visit.     BP 108/68 (BP Location: Left Arm, Patient Position: Sitting, Cuff Size: Normal)   Pulse 91   Temp 99.2 F (37.3 C) (Oral)   Ht 5\' 5"  (1.651 m)   Wt 123 lb (55.8 kg)   SpO2 96%   BMI  20.47 kg/m      Objective:   Physical Exam  Constitutional: She appears well-developed and well-nourished. She is cooperative.  Non-toxic appearance. She does not have a sickly appearance. She does not appear ill. No distress.  Neck: Normal range of motion.  Cardiovascular: Normal rate, regular rhythm and normal heart sounds.  PMI is not displaced.   Pulmonary/Chest: Effort normal and breath sounds normal. No accessory muscle usage. No respiratory distress. She has no decreased breath sounds. She has no wheezes. She has no rhonchi. She has no rales.  Lungs CTA bilaterally  Lymphadenopathy:    She has no cervical adenopathy.  Neurological: She is alert.  Skin: Skin is warm, dry and intact.  Nursing note and vitals reviewed.      Assessment & Plan:  1. Influenza High suspicion for influenza. Treat with Tamiflu per orders. Symptomatic care with alternating Tylenol and Ibuprofen. Tessalon as needed for cough. Advised patient as follows: Take the Tessalon as needed for cough.  Increase fluids.  Rest.   Take the Tamiflu as prescribed. Call or return to clinic if symptoms are not improving, especially if worsening fever or shortness of breath.  Inda Coke PA-C 11/29/16

## 2016-12-28 ENCOUNTER — Encounter: Payer: Self-pay | Admitting: Family Medicine

## 2016-12-28 ENCOUNTER — Ambulatory Visit (INDEPENDENT_AMBULATORY_CARE_PROVIDER_SITE_OTHER): Payer: BLUE CROSS/BLUE SHIELD | Admitting: Family Medicine

## 2016-12-28 VITALS — BP 108/70 | HR 90 | Temp 98.5°F | Wt 124.4 lb

## 2016-12-28 DIAGNOSIS — R1011 Right upper quadrant pain: Secondary | ICD-10-CM | POA: Diagnosis not present

## 2016-12-28 DIAGNOSIS — Z13220 Encounter for screening for lipoid disorders: Secondary | ICD-10-CM | POA: Diagnosis not present

## 2016-12-28 DIAGNOSIS — F902 Attention-deficit hyperactivity disorder, combined type: Secondary | ICD-10-CM

## 2016-12-28 MED ORDER — AMPHETAMINE-DEXTROAMPHETAMINE 5 MG PO TABS: 5.0000 mg | ORAL_TABLET | Freq: Every day | ORAL | 0 refills | Status: DC

## 2016-12-28 MED ORDER — AMPHETAMINE-DEXTROAMPHETAMINE 30 MG PO TABS
30.0000 mg | ORAL_TABLET | Freq: Two times a day (BID) | ORAL | 0 refills | Status: DC
Start: 2016-12-28 — End: 2017-03-30

## 2016-12-28 MED ORDER — AMPHETAMINE-DEXTROAMPHETAMINE 30 MG PO TABS: 30.0000 mg | ORAL_TABLET | Freq: Two times a day (BID) | ORAL | Status: DC

## 2016-12-28 NOTE — Progress Notes (Signed)
Pre visit review using our clinic review tool, if applicable. No additional management support is needed unless otherwise documented below in the visit note. 

## 2016-12-28 NOTE — Assessment & Plan Note (Addendum)
Stable on Adderall. Refills provided. Follow-up in 3 months.

## 2016-12-28 NOTE — Patient Instructions (Signed)
Nice to see you. We'll refill your Adderall. Please continue your Protonix. The discomfort you have is likely gastritis given your history. You may take this twice daily if you would like. If you have persistent issues we can have you see GI. We will have you return for fasting lab work.

## 2016-12-28 NOTE — Assessment & Plan Note (Signed)
Had a fairly extensive workup of this including HIDA scan and ultrasound with no obvious cause. Does report that it is worsened with drinking alcohol and particularly has a history of gastritis. Suspect symptoms may be related to gastritis versus muscular cause. She'll continue her Protonix. She'll continue to monitor. She reports the GU history in the last 2-3 years. If persists or worsens could consider referral back to GI.

## 2016-12-28 NOTE — Progress Notes (Signed)
Tommi Rumps, MD Phone: [phone]  Jacqueline Craig is a 31 y.o. female who presents today for follow-up.  ADHD: Taking Adderall. Notes this is beneficial. Rare palpitations that are significantly improved from previously with adequate sleep. No weight changes or appetite changes.  Patient intermittently has epigastric and right upper quadrant pain. This has been evaluated with lab work and ultrasound and HIDA scan. It is worse with alcohol. She has a history of gastritis and reflux and underwent an EGD when she lived in [place] that did not have any H. pylori. She currently takes Protonix once daily though was prescribed a twice daily previously. She has bowel movement daily. She has an IUD.  PMH: Former smoker   ROS see history of present illness  Objective  Physical Exam Vitals:   12/28/16 1415  BP: 108/70  Pulse: 90  Temp: 98.5 F (36.9 C)    BP Readings from Last 3 Encounters:  12/28/16 108/70  11/29/16 108/68  09/29/16 108/78   Wt Readings from Last 3 Encounters:  12/28/16 124 lb 6.4 oz (56.4 kg)  11/29/16 123 lb (55.8 kg)  09/29/16 123 lb 3.2 oz (55.9 kg)    Physical Exam  Constitutional: No distress.  Cardiovascular: Normal rate, regular rhythm and normal heart sounds.   Pulmonary/Chest: Effort normal and breath sounds normal.  Abdominal: Soft. Bowel sounds are normal. She exhibits no distension. There is no tenderness. There is no rebound and no guarding.  Musculoskeletal: She exhibits no edema.  Neurological: She is alert. Gait normal.  Skin: Skin is warm and dry. She is not diaphoretic.     Assessment/Plan: Please see individual problem list.  Attention deficit hyperactivity disorder (ADHD) Stable on Adderall. Refills provided. Follow-up in 3 months.  Right upper quadrant pain Had a fairly extensive workup of this including HIDA scan and ultrasound with no obvious cause. Does report that it is worsened with drinking alcohol and particularly has a  history of gastritis. Suspect symptoms may be related to gastritis versus muscular cause. She'll continue her Protonix. She'll continue to monitor. She reports the GU history in the last 2-3 years. If persists or worsens could consider referral back to GI.   No orders of the defined types were placed in this encounter.   Meds ordered this encounter  Medications  . amphetamine-dextroamphetamine (ADDERALL) 5 MG tablet    Sig: Take 1 tablet (5 mg total) by mouth daily.    Dispense:  30 tablet    Refill:  0  . amphetamine-dextroamphetamine (ADDERALL) 30 MG tablet    Sig: Take 1 tablet by mouth 2 (two) times daily.    Dispense:  60 tablet    Refill:  00  . amphetamine-dextroamphetamine (ADDERALL) 30 MG tablet    Sig: Take 1 tablet by mouth 2 (two) times daily. Do not fill until 01/28/17    Dispense:  60 tablet    Refill:  0  . amphetamine-dextroamphetamine (ADDERALL) 30 MG tablet    Sig: Take 1 tablet by mouth 2 (two) times daily. Do not fill until 02/27/17    Dispense:  60 tablet    Refill:  0  . amphetamine-dextroamphetamine (ADDERALL) 5 MG tablet    Sig: Take 1 tablet (5 mg total) by mouth daily. Do not fill until 01/28/17    Dispense:  30 tablet    Refill:  0  . amphetamine-dextroamphetamine (ADDERALL) 5 MG tablet    Sig: Take 1 tablet (5 mg total) by mouth daily. Do not fill until 02/27/17  Dispense:  30 tablet    Refill:  0   Tommi Rumps, MD [place]

## 2017-03-07 ENCOUNTER — Other Ambulatory Visit: Payer: Self-pay | Admitting: Family Medicine

## 2017-03-15 ENCOUNTER — Other Ambulatory Visit: Payer: Self-pay | Admitting: Family Medicine

## 2017-03-15 MED ORDER — VALACYCLOVIR HCL 1 G PO TABS: 2000.0000 mg | ORAL_TABLET | Freq: Two times a day (BID) | ORAL | 0 refills | Status: DC

## 2017-03-30 ENCOUNTER — Ambulatory Visit (INDEPENDENT_AMBULATORY_CARE_PROVIDER_SITE_OTHER): Payer: BLUE CROSS/BLUE SHIELD | Admitting: Family Medicine

## 2017-03-30 ENCOUNTER — Encounter: Payer: Self-pay | Admitting: Family Medicine

## 2017-03-30 DIAGNOSIS — F902 Attention-deficit hyperactivity disorder, combined type: Secondary | ICD-10-CM | POA: Diagnosis not present

## 2017-03-30 MED ORDER — AMPHETAMINE-DEXTROAMPHETAMINE 30 MG PO TABS
15.0000 mg | ORAL_TABLET | Freq: Two times a day (BID) | ORAL | Status: DC
Start: 2017-03-30 — End: 2017-07-04

## 2017-03-30 MED ORDER — AMPHETAMINE-DEXTROAMPHETAMINE 30 MG PO TABS: 15.0000 mg | ORAL_TABLET | Freq: Two times a day (BID) | ORAL | 0 refills | Status: DC

## 2017-03-30 MED ORDER — AMPHETAMINE-DEXTROAMPHETAMINE 30 MG PO TABS
15.0000 mg | ORAL_TABLET | Freq: Two times a day (BID) | ORAL | 0 refills | Status: DC
Start: 2017-03-30 — End: 2017-07-04

## 2017-03-30 NOTE — Assessment & Plan Note (Signed)
Patient with some side effects related to the Adderall. Symptoms resolved when she decreased the dose. She'll continue the 15 mg twice daily. If she has recurrence of her symptoms she'll contact us and we'll likely need to switch her medication. She does not need any refills given she has decreased the dose. She'll contact us when she is ready for refills.

## 2017-03-30 NOTE — Progress Notes (Signed)
  Tommi Rumps, MD Phone: [phone]  Jacqueline Craig is a 31 y.o. female who presents today for follow-up.  ADHD: Patient is taking Adderall. She had to decrease the dose down to 15 mg twice daily as she was having palpitations and increased anxiety. Notes these symptoms resolved with the decreased dose. The decreased dose is not quite as beneficial as it was though she is waiting to adjust to this. She notes no appetite or weight changes.  ROS see history of present illness  Objective  Physical Exam Vitals:   03/30/17 1344  BP: 104/66  Pulse: 90  Temp: 99 F (37.2 C)    BP Readings from Last 3 Encounters:  03/30/17 104/66  12/28/16 108/70  11/29/16 108/68   Wt Readings from Last 3 Encounters:  03/30/17 126 lb (57.2 kg)  12/28/16 124 lb 6.4 oz (56.4 kg)  11/29/16 123 lb (55.8 kg)    Physical Exam  Constitutional: No distress.  Cardiovascular: Normal rate, regular rhythm and normal heart sounds.   Pulmonary/Chest: Effort normal and breath sounds normal.  Neurological: She is alert. Gait normal.  Skin: She is not diaphoretic.     Assessment/Plan: Please see individual problem list.  Attention deficit hyperactivity disorder (ADHD) Patient with some side effects related to the Adderall. Symptoms resolved when she decreased the dose. She'll continue the 15 mg twice daily. If she has recurrence of her symptoms she'll contact us and we'll likely need to switch her medication. She does not need any refills given she has decreased the dose. She'll contact us when she is ready for refills.  Tommi Rumps, MD [place]

## 2017-03-30 NOTE — Patient Instructions (Signed)
Nice to see you. Please continue at the lower dose of Adderall. If you have recurrent palpitations please let us know.

## 2017-04-29 ENCOUNTER — Other Ambulatory Visit: Payer: Self-pay | Admitting: Family Medicine

## 2017-06-17 ENCOUNTER — Other Ambulatory Visit: Payer: Self-pay | Admitting: Family Medicine

## 2017-07-01 ENCOUNTER — Ambulatory Visit: Payer: Self-pay | Admitting: Family Medicine

## 2017-07-04 ENCOUNTER — Encounter: Payer: Self-pay | Admitting: Family Medicine

## 2017-07-04 ENCOUNTER — Ambulatory Visit (INDEPENDENT_AMBULATORY_CARE_PROVIDER_SITE_OTHER): Payer: BLUE CROSS/BLUE SHIELD | Admitting: Family Medicine

## 2017-07-04 DIAGNOSIS — R1011 Right upper quadrant pain: Secondary | ICD-10-CM

## 2017-07-04 DIAGNOSIS — F902 Attention-deficit hyperactivity disorder, combined type: Secondary | ICD-10-CM | POA: Diagnosis not present

## 2017-07-04 MED ORDER — AMPHETAMINE-DEXTROAMPHETAMINE 30 MG PO TABS
15.0000 mg | ORAL_TABLET | Freq: Two times a day (BID) | ORAL | 0 refills | Status: DC
Start: 2017-07-04 — End: 2017-07-04

## 2017-07-04 MED ORDER — AMPHETAMINE-DEXTROAMPHETAMINE 5 MG PO TABS: 5.0000 mg | ORAL_TABLET | Freq: Every day | ORAL | 0 refills | Status: DC

## 2017-07-04 MED ORDER — AMPHETAMINE-DEXTROAMPHETAMINE 30 MG PO TABS: 15.0000 mg | ORAL_TABLET | Freq: Two times a day (BID) | ORAL | 0 refills | Status: DC

## 2017-07-04 MED ORDER — RANITIDINE HCL 150 MG PO TABS: 150.0000 mg | ORAL_TABLET | Freq: Two times a day (BID) | ORAL | 1 refills | Status: DC

## 2017-07-04 NOTE — Progress Notes (Signed)
Tommi Rumps, MD Phone: [phone]  Jacqueline Craig is a 31 y.o. female who presents today for follow-up  ADHD: Taking Adderall 15 mg twice a day and 5 mg late in the day as needed. Palpitations went away. Sleep was an issue though she thinks it may have been related to her being on a stressful rotation in PA school. Appetite is unchanged. Weight no changes. 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 This has improved since switching rotations.  Patient continues to have intermittent right upper quadrant epigastric discomfort. Notes it's still there most of the time. We tried her on Protonix and this did help though she discontinued it given GFR concerns. Some occasional nausea and diarrhea though nothing consistent. No vomiting. She reports having had an EGD about 3 years ago that revealed gastritis.  ROS see history of present illness  Objective  Physical Exam Vitals:   07/04/17 0840  BP: 108/80  Pulse: 89  Temp: 98.6 F (37 C)  SpO2: 95%    BP Readings from Last 3 Encounters:  07/04/17 108/80  03/30/17 104/66  12/28/16 108/70   Wt Readings from Last 3 Encounters:  07/04/17 123 lb 12.8 oz (56.2 kg)  03/30/17 126 lb (57.2 kg)  12/28/16 124 lb 6.4 oz (56.4 kg)    Physical Exam  Constitutional: No distress.  Cardiovascular: Normal rate, regular rhythm and normal heart sounds.   Pulmonary/Chest: Effort normal and breath sounds normal.  Abdominal: Soft. Bowel sounds are normal. She exhibits no distension.  Mild discomfort on right upper quadrant palpation  Neurological: She is alert. Gait normal.  Skin: She is not diaphoretic.     Assessment/Plan: Please see individual problem list.  Attention deficit hyperactivity disorder (ADHD) Doing well on medication. I suspect her sleep issues and fatigue are related to the rotation that she was on as it has improved since she is now on a new  rotation. She'll monitor those things and if they continue to be an issue or if they worsen again she will let us know. Refills given.  Right upper quadrant pain Did improve with Protonix though she has discontinued this. Does have a history of gastritis and that could be playing a role. We'll try Zantac to see if it will be beneficial. I did advise further workup through GI though she wanted to defer this. Also discussed possibility of a CT scan of her abdomen and pelvis given persistent issues and she opted to defer this as well. If she changes her mind she will let us know.   No orders of the defined types were placed in this encounter.   Meds ordered this encounter  Medications  . DISCONTD: amphetamine-dextroamphetamine (ADDERALL) 30 MG tablet    Sig: Take 0.5 tablets by mouth 2 (two) times daily.    Dispense:  30 tablet    Refill:  0  . DISCONTD: amphetamine-dextroamphetamine (ADDERALL) 30 MG tablet    Sig: Take 0.5 tablets by mouth 2 (two) times daily. Do not fill until 08/03/17    Dispense:  30 tablet    Refill:  0  . DISCONTD: amphetamine-dextroamphetamine (ADDERALL) 30 MG tablet    Sig: Take 0.5 tablets by mouth 2 (two) times daily. Do not fill until 09/03/17    Dispense:  30 tablet    Refill:  0  . amphetamine-dextroamphetamine (ADDERALL) 5 MG tablet    Sig: Take 1 tablet (5 mg total) by  mouth daily.    Dispense:  30 tablet    Refill:  0  . amphetamine-dextroamphetamine (ADDERALL) 5 MG tablet    Sig: Take 1 tablet (5 mg total) by mouth daily. Do not fill until 08/03/17    Dispense:  30 tablet    Refill:  0  . amphetamine-dextroamphetamine (ADDERALL) 5 MG tablet    Sig: Take 1 tablet (5 mg total) by mouth daily. Do not fill until 09/03/17    Dispense:  30 tablet    Refill:  0  . ranitidine (ZANTAC) 150 MG tablet    Sig: Take 1 tablet (150 mg total) by mouth 2 (two) times daily.    Dispense:  60 tablet    Refill:  1  . amphetamine-dextroamphetamine (ADDERALL) 30 MG  tablet    Sig: Take 0.5 tablets by mouth 2 (two) times daily.    Dispense:  30 tablet    Refill:  0  . amphetamine-dextroamphetamine (ADDERALL) 30 MG tablet    Sig: Take 0.5 tablets by mouth 2 (two) times daily. Do not fill until 08/03/17    Dispense:  30 tablet    Refill:  0  . amphetamine-dextroamphetamine (ADDERALL) 30 MG tablet    Sig: Take 0.5 tablets by mouth 2 (two) times daily. Do not fill until 09/03/17    Dispense:  30 tablet    Refill:  0   Tommi Rumps, MD [place]

## 2017-07-04 NOTE — Assessment & Plan Note (Signed)
Did improve with Protonix though she has discontinued this. Does have a history of gastritis and that could be playing a role. We'll try Zantac to see if it will be beneficial. I did advise further workup through GI though she wanted to defer this. Also discussed possibility of a CT scan of her abdomen and pelvis given persistent issues and she opted to defer this as well. If she changes her mind she will let us know.

## 2017-07-04 NOTE — Assessment & Plan Note (Signed)
Doing well on medication. I suspect her sleep issues and fatigue are related to the rotation that she was on as it has improved since she is now on a new rotation. She'll monitor those things and if they continue to be an issue or if they worsen again she will let us know. Refills given.

## 2017-07-04 NOTE — Patient Instructions (Signed)
Nice to see you. Please monitor the tiredness and sleeping difficulty. If this does not continue to improve with being on a new rotation please let us know. I would suggest seeing GI or obtaining a CT scan given your continued abdominal issues. If you change your mind and want to do either of these this year please let us know.

## 2017-08-30 ENCOUNTER — Encounter: Payer: Self-pay | Admitting: Family Medicine

## 2017-09-11 ENCOUNTER — Other Ambulatory Visit: Payer: Self-pay | Admitting: Family Medicine

## 2017-09-13 MED ORDER — BUPROPION HCL ER (XL) 300 MG PO TB24: 300.0000 mg | ORAL_TABLET | Freq: Every day | ORAL | 0 refills | Status: DC

## 2017-09-13 MED ORDER — VALACYCLOVIR HCL 1 G PO TABS: 2000.0000 mg | ORAL_TABLET | Freq: Two times a day (BID) | ORAL | 0 refills | Status: DC

## 2017-09-16 ENCOUNTER — Other Ambulatory Visit: Payer: Self-pay

## 2017-09-16 ENCOUNTER — Ambulatory Visit (INDEPENDENT_AMBULATORY_CARE_PROVIDER_SITE_OTHER): Payer: BLUE CROSS/BLUE SHIELD | Admitting: Family Medicine

## 2017-09-16 ENCOUNTER — Encounter: Payer: Self-pay | Admitting: Family Medicine

## 2017-09-16 DIAGNOSIS — F902 Attention-deficit hyperactivity disorder, combined type: Secondary | ICD-10-CM | POA: Diagnosis not present

## 2017-09-16 MED ORDER — AMPHETAMINE-DEXTROAMPHETAMINE 5 MG PO TABS: 5.0000 mg | ORAL_TABLET | Freq: Every day | ORAL | 0 refills | Status: DC

## 2017-09-16 MED ORDER — AMPHETAMINE-DEXTROAMPHETAMINE 20 MG PO TABS: 20.0000 mg | ORAL_TABLET | Freq: Two times a day (BID) | ORAL | 0 refills | Status: DC

## 2017-09-16 NOTE — Patient Instructions (Signed)
Nice to see you. Please try the 20 mg of Adderall twice daily and see if this is beneficial.  Once you get through the holidays please let us know and we can try the extended release version.

## 2017-09-16 NOTE — Progress Notes (Signed)
  Tommi Rumps, MD Phone: [phone]  Jacqueline Craig is a 31 y.o. female who presents today for follow-up.  ADHD: Taking Adderall 15 mg twice daily with a 5 mg used in the afternoon.  The 15 mg is not really as beneficial as prior higher doses.  She has not had any recent palpitations.  No sleep issues.  No appetite suppression.  She wants to know what her other options are.  She is tried Ritalin and Concerta in the past with little benefit or side effects.  Social History   Tobacco Use  Smoking Status Former Smoker  Smokeless Tobacco Never Used     ROS see history of present illness  Objective  Physical Exam Vitals:   09/16/17 1320  BP: 108/64  Pulse: 77  Temp: 98.1 F (36.7 C)  SpO2: 95%    BP Readings from Last 3 Encounters:  09/16/17 108/64  07/04/17 108/80  03/30/17 104/66   Wt Readings from Last 3 Encounters:  09/16/17 125 lb 9.6 oz (57 kg)  07/04/17 123 lb 12.8 oz (56.2 kg)  03/30/17 126 lb (57.2 kg)    Physical Exam  Constitutional: No distress.  Cardiovascular: Normal rate, regular rhythm and normal heart sounds.  Pulmonary/Chest: Effort normal and breath sounds normal.  Skin: She is not diaphoretic.     Assessment/Plan: Please see individual problem list.  Attention deficit hyperactivity disorder (ADHD) We will trial increasing her Adderall to 20 mg twice daily.  She will continue the 5 mg dose.  She will let us know if she has recurrence of palpitations.  Once we get through the holidays she would like to try the extended release version of Adderall to see if that would be more beneficial for her test taking.   Jacqueline Craig was seen today for follow-up.  Diagnoses and all orders for this visit:  Attention deficit hyperactivity disorder (ADHD), combined type  Other orders -     amphetamine-dextroamphetamine (ADDERALL) 5 MG tablet; Take 1 tablet (5 mg total) by mouth daily. -     amphetamine-dextroamphetamine (ADDERALL) 20 MG tablet; Take 1  tablet (20 mg total) by mouth 2 (two) times daily.    No orders of the defined types were placed in this encounter.   Meds ordered this encounter  Medications  . amphetamine-dextroamphetamine (ADDERALL) 5 MG tablet    Sig: Take 1 tablet (5 mg total) by mouth daily.    Dispense:  30 tablet    Refill:  0  . amphetamine-dextroamphetamine (ADDERALL) 20 MG tablet    Sig: Take 1 tablet (20 mg total) by mouth 2 (two) times daily.    Dispense:  60 tablet    Refill:  0     Tommi Rumps, MD [place]

## 2017-09-16 NOTE — Assessment & Plan Note (Signed)
We will trial increasing her Adderall to 20 mg twice daily.  She will continue the 5 mg dose.  She will let us know if she has recurrence of palpitations.  Once we get through the holidays she would like to try the extended release version of Adderall to see if that would be more beneficial for her test taking.

## 2017-10-10 ENCOUNTER — Other Ambulatory Visit: Payer: Self-pay | Admitting: Ophthalmology

## 2017-10-10 DIAGNOSIS — H532 Diplopia: Secondary | ICD-10-CM

## 2017-10-13 ENCOUNTER — Encounter: Payer: Self-pay | Admitting: Family Medicine

## 2017-10-14 ENCOUNTER — Other Ambulatory Visit: Payer: Self-pay | Admitting: Family Medicine

## 2017-10-14 MED ORDER — AMPHETAMINE-DEXTROAMPHETAMINE 5 MG PO TABS: 5.0000 mg | ORAL_TABLET | Freq: Every day | ORAL | 0 refills | Status: DC

## 2017-10-14 MED ORDER — AMPHETAMINE-DEXTROAMPHETAMINE 20 MG PO TABS: 20.0000 mg | ORAL_TABLET | Freq: Two times a day (BID) | ORAL | 0 refills | Status: DC

## 2017-10-18 ENCOUNTER — Encounter: Payer: Self-pay | Admitting: *Deleted

## 2017-10-18 ENCOUNTER — Encounter: Payer: Self-pay | Admitting: Family Medicine

## 2017-10-18 ENCOUNTER — Ambulatory Visit
Admission: RE | Admit: 2017-10-18 | Discharge: 2017-10-18 | Disposition: A | Discharge status: Home / Self Care | Payer: BLUE CROSS/BLUE SHIELD | Source: Ambulatory Visit | Attending: Ophthalmology | Admitting: Ophthalmology

## 2017-10-18 ENCOUNTER — Ambulatory Visit (INDEPENDENT_AMBULATORY_CARE_PROVIDER_SITE_OTHER): Payer: BLUE CROSS/BLUE SHIELD | Admitting: *Deleted

## 2017-10-18 ENCOUNTER — Other Ambulatory Visit: Payer: Self-pay | Admitting: Family Medicine

## 2017-10-18 VITALS — BP 118/76 | HR 88 | Temp 98.2°F | Resp 18

## 2017-10-18 DIAGNOSIS — F902 Attention-deficit hyperactivity disorder, combined type: Secondary | ICD-10-CM

## 2017-10-18 DIAGNOSIS — H532 Diplopia: Secondary | ICD-10-CM | POA: Diagnosis present

## 2017-10-18 MED ORDER — AMPHETAMINE-DEXTROAMPHETAMINE 5 MG PO TABS: 5.0000 mg | ORAL_TABLET | Freq: Every day | ORAL | 0 refills | Status: DC

## 2017-10-18 MED ORDER — AMPHETAMINE-DEXTROAMPHETAMINE 20 MG PO TABS: 20.0000 mg | ORAL_TABLET | Freq: Two times a day (BID) | ORAL | 0 refills | Status: DC

## 2017-10-18 NOTE — Progress Notes (Signed)
Per My chart message from 10/13/17 patient in vital sign check for new adderall prescriptions, BP 118/76 pulse 88 rep, 18 02 sat 99, patient given scripts and were signed for at front desk.

## 2017-10-18 NOTE — Progress Notes (Signed)
Vitals acceptable.  Refills given.

## 2017-11-08 ENCOUNTER — Other Ambulatory Visit: Payer: Self-pay | Admitting: Family Medicine

## 2017-11-09 MED ORDER — VALACYCLOVIR HCL 1 G PO TABS: 2000.0000 mg | ORAL_TABLET | Freq: Two times a day (BID) | ORAL | 0 refills | Status: DC

## 2017-12-10 ENCOUNTER — Other Ambulatory Visit: Payer: Self-pay | Admitting: Family Medicine

## 2017-12-15 ENCOUNTER — Other Ambulatory Visit: Payer: Self-pay | Admitting: Family Medicine

## 2017-12-16 MED ORDER — VALACYCLOVIR HCL 1 G PO TABS: 2000.0000 mg | ORAL_TABLET | Freq: Two times a day (BID) | ORAL | 0 refills | Status: DC

## 2018-01-11 ENCOUNTER — Other Ambulatory Visit: Payer: Self-pay | Admitting: Family Medicine

## 2018-01-11 MED ORDER — AMPHETAMINE-DEXTROAMPHETAMINE 20 MG PO TABS: 20.0000 mg | ORAL_TABLET | Freq: Two times a day (BID) | ORAL | 0 refills | Status: DC

## 2018-01-11 MED ORDER — AMPHETAMINE-DEXTROAMPHETAMINE 5 MG PO TABS: 5.0000 mg | ORAL_TABLET | Freq: Every day | ORAL | 0 refills | Status: DC

## 2018-01-11 NOTE — Telephone Encounter (Signed)
adderall LOV: 10/18/17 PCP: [facility]: [place] , [place]

## 2018-01-11 NOTE — Telephone Encounter (Signed)
Copied from [place] [phone]. Topic: Quick Communication - Rx Refill/Question >> Jan 11, 2018  1:14 PM Ether Griffins B wrote: Medication: Adderall 20mg  and Adderall 5mg   Preferred Pharmacy (with phone number or street name): [id number] - [place], [place]: Please be advised that RX refills may take up to 3 business days. We ask that you follow-up with your pharmacy.

## 2018-01-24 ENCOUNTER — Ambulatory Visit: Payer: Self-pay | Admitting: Family Medicine

## 2018-01-25 ENCOUNTER — Encounter: Payer: Self-pay | Admitting: Family Medicine

## 2018-01-25 ENCOUNTER — Ambulatory Visit (INDEPENDENT_AMBULATORY_CARE_PROVIDER_SITE_OTHER): Payer: BLUE CROSS/BLUE SHIELD | Admitting: Family Medicine

## 2018-01-25 ENCOUNTER — Other Ambulatory Visit: Payer: Self-pay

## 2018-01-25 VITALS — BP 112/62 | HR 79 | Temp 98.4°F | Wt 127.8 lb

## 2018-01-25 DIAGNOSIS — G8929 Other chronic pain: Secondary | ICD-10-CM | POA: Diagnosis not present

## 2018-01-25 DIAGNOSIS — K625 Hemorrhage of anus and rectum: Secondary | ICD-10-CM

## 2018-01-25 DIAGNOSIS — R1011 Right upper quadrant pain: Secondary | ICD-10-CM

## 2018-01-25 DIAGNOSIS — F329 Major depressive disorder, single episode, unspecified: Secondary | ICD-10-CM

## 2018-01-25 DIAGNOSIS — F32A Depression, unspecified: Secondary | ICD-10-CM | POA: Insufficient documentation

## 2018-01-25 DIAGNOSIS — Z8619 Personal history of other infectious and parasitic diseases: Secondary | ICD-10-CM | POA: Diagnosis not present

## 2018-01-25 DIAGNOSIS — K921 Melena: Secondary | ICD-10-CM

## 2018-01-25 DIAGNOSIS — F419 Anxiety disorder, unspecified: Secondary | ICD-10-CM | POA: Diagnosis not present

## 2018-01-25 DIAGNOSIS — F902 Attention-deficit hyperactivity disorder, combined type: Secondary | ICD-10-CM | POA: Diagnosis not present

## 2018-01-25 LAB — COMPREHENSIVE METABOLIC PANEL
ALBUMIN: 4.4 g/dL (ref 3.5–5.2)
ALK PHOS: 56 U/L (ref 39–117)
ALT: 22 U/L (ref 0–35)
AST: 22 U/L (ref 0–37)
BUN: 23 mg/dL (ref 6–23)
CHLORIDE: 102 meq/L (ref 96–112)
CO2: 29 mEq/L (ref 19–32)
Calcium: 10 mg/dL (ref 8.4–10.5)
Creatinine, Ser: 1.01 mg/dL (ref 0.40–1.20)
GFR: 66.79 mL/min (ref >60.00)
Glucose, Bld: 92 mg/dL (ref 70–99)
POTASSIUM: 4.5 meq/L (ref 3.5–5.1)
Sodium: 137 mEq/L (ref 135–145)
Total Bilirubin: 0.3 mg/dL (ref 0.2–1.2)
Total Protein: 6.9 g/dL (ref 6.0–8.3)

## 2018-01-25 LAB — CBC WITH DIFFERENTIAL/PLATELET
BASOS ABS: 0 10*3/uL (ref 0.0–0.1)
BASOS PCT: 0.8 % (ref 0.0–3.0)
EOS ABS: 0.1 10*3/uL (ref 0.0–0.7)
Eosinophils Relative: 1.3 % (ref 0.0–5.0)
HCT: 40.9 % (ref 36.0–46.0)
Hemoglobin: 14.1 g/dL (ref 12.0–15.0)
LYMPHS ABS: 1.3 10*3/uL (ref 0.7–4.0)
LYMPHS PCT: 28.3 % (ref 12.0–46.0)
MCHC: 34.4 g/dL (ref 30.0–36.0)
MCV: 99.6 fl (ref 78.0–100.0)
MONO ABS: 0.3 10*3/uL (ref 0.1–1.0)
Monocytes Relative: 5.7 % (ref 3.0–12.0)
NEUTROS ABS: 3 10*3/uL (ref 1.4–7.7)
NEUTROS PCT: 63.9 % (ref 43.0–77.0)
PLATELETS: 230 10*3/uL (ref 150.0–400.0)
RBC: 4.1 Mil/uL (ref 3.87–5.11)
RDW: 13.5 % (ref 11.5–15.5)
WBC: 4.6 10*3/uL (ref 4.0–10.5)

## 2018-01-25 MED ORDER — AMPHETAMINE-DEXTROAMPHETAMINE 20 MG PO TABS: 20.0000 mg | ORAL_TABLET | Freq: Two times a day (BID) | ORAL | 0 refills | Status: DC

## 2018-01-25 MED ORDER — AMPHETAMINE-DEXTROAMPHETAMINE 5 MG PO TABS: 5.0000 mg | ORAL_TABLET | Freq: Every day | ORAL | 0 refills | Status: DC

## 2018-01-25 MED ORDER — BUPROPION HCL ER (XL) 150 MG PO TB24: 150.0000 mg | ORAL_TABLET | Freq: Every day | ORAL | 1 refills | Status: DC

## 2018-01-25 MED ORDER — VALACYCLOVIR HCL 1 G PO TABS: 2000.0000 mg | ORAL_TABLET | Freq: Two times a day (BID) | ORAL | 1 refills | Status: DC

## 2018-01-25 NOTE — Assessment & Plan Note (Signed)
Possible mild symptoms.  She has been on Wellbutrin for some time now.  She would be interested in coming off of this if she is able.  We will decrease her dose to 150 mg daily and see how she is doing at follow-up in 3 months.

## 2018-01-25 NOTE — Progress Notes (Signed)
Tommi Rumps, MD Phone: [phone]  Jacqueline Craig is a 32 y.o. female who presents today for f/u.  ADHD: Taking Adderall 20 mg twice daily.  She has been out of school and not started her job yet so she has not been taking the 5 mg booster dose.  No palpitations, appetite changes, or sleep changes.  She has chronically been on Wellbutrin for smoking cessation initially though has been on it for years as she reports everybody has been afraid to take her off of this.  She does note it is hard for her to tell if she feels depressed or anxious though does endorse some of those issues.  No SI.  She would be interested in coming off of this if she is able.  She has had chronic right upper quadrant pain for several years.  Worsens when she drinks soda.  Zantac helps some.  Protonix did help though she was wary of the kidney effects of it.  She reports an EGD in the past that had some changes.  She has had a HIDA scan and ultrasound no noted cause.  She notes 1-2 episodes of bright red blood per rectum about a year ago.  She occasionally has black tarry stools that will occur with a single bowel movement in a single day and then not recur for some time.  Last time was a week or so ago.  Social History   Tobacco Use  Smoking Status Former Smoker  Smokeless Tobacco Never Used     ROS see history of present illness  Objective  Physical Exam Vitals:   01/25/18 1134  BP: 112/62  Pulse: 79  Temp: 98.4 F (36.9 C)  SpO2: 97%    BP Readings from Last 3 Encounters:  01/25/18 112/62  10/18/17 118/76  09/16/17 108/64   Wt Readings from Last 3 Encounters:  01/25/18 127 lb 12.8 oz (58 kg)  09/16/17 125 lb 9.6 oz (57 kg)  07/04/17 123 lb 12.8 oz (56.2 kg)    Physical Exam  Constitutional: No distress.  Cardiovascular: Normal rate, regular rhythm and normal heart sounds.  Pulmonary/Chest: Effort normal and breath sounds normal.  Abdominal: Soft. Bowel sounds are normal. She  exhibits no distension. There is no tenderness. There is no rebound and no guarding.  Musculoskeletal: She exhibits no edema.  Neurological: She is alert.  Skin: Skin is warm and dry. She is not diaphoretic.     Assessment/Plan: Please see individual problem list.  Attention deficit hyperactivity disorder (ADHD) Stable on current dosing.  She will continue this.  Refills given.  H/O cold sores Last outbreak was while she was in [place].  She needs a refill on Valtrex.  Right upper quadrant pain Chronic intermittent issue.  It sounds as though it may be gastritis or ulcer related given some of her possible bleeding episodes.  No symptoms currently.  She should avoid soda.  Discussed going back on Protonix though she would like to check her GFR first.  We will refer to GI given chronicity and possible melena.  Check CBC.  Given return precautions.  Anxiety and depression Possible mild symptoms.  She has been on Wellbutrin for some time now.  She would be interested in coming off of this if she is able.  We will decrease her dose to 150 mg daily and see how she is doing at follow-up in 3 months.   Orders Placed This Encounter  Procedures  . Comp Met (CMET)  . CBC w/Diff  .  Ambulatory referral to Gastroenterology    Referral Priority:   Routine    Referral Type:   Consultation    Referral Reason:   Specialty Services Required    Number of Visits Requested:   1    Meds ordered this encounter  Medications  . amphetamine-dextroamphetamine (ADDERALL) 20 MG tablet    Sig: Take 1 tablet (20 mg total) by mouth 2 (two) times daily.    Dispense:  60 tablet    Refill:  0  . amphetamine-dextroamphetamine (ADDERALL) 20 MG tablet    Sig: Take 1 tablet (20 mg total) by mouth 2 (two) times daily.    Dispense:  60 tablet    Refill:  0  . amphetamine-dextroamphetamine (ADDERALL) 20 MG tablet    Sig: Take 1 tablet (20 mg total) by mouth 2 (two) times daily.    Dispense:  60 tablet     Refill:  0  . amphetamine-dextroamphetamine (ADDERALL) 5 MG tablet    Sig: Take 1 tablet (5 mg total) by mouth daily.    Dispense:  30 tablet    Refill:  0  . amphetamine-dextroamphetamine (ADDERALL) 5 MG tablet    Sig: Take 1 tablet (5 mg total) by mouth daily.    Dispense:  30 tablet    Refill:  0  . amphetamine-dextroamphetamine (ADDERALL) 5 MG tablet    Sig: Take 1 tablet (5 mg total) by mouth daily.    Dispense:  30 tablet    Refill:  0  . buPROPion (WELLBUTRIN XL) 150 MG 24 hr tablet    Sig: Take 1 tablet (150 mg total) by mouth daily.    Dispense:  90 tablet    Refill:  1  . valACYclovir (VALTREX) 1000 MG tablet    Sig: Take 2 tablets (2,000 mg total) by mouth 2 (two) times daily. For one day at first sign of lesions.    Dispense:  10 tablet    Refill:  [place], MD [place]

## 2018-01-25 NOTE — Assessment & Plan Note (Signed)
Last outbreak was while she was in [place].  She needs a refill on Valtrex.

## 2018-01-25 NOTE — Assessment & Plan Note (Signed)
Stable on current dosing.  She will continue this.  Refills given.

## 2018-01-25 NOTE — Patient Instructions (Signed)
Nice to see you. We will get you referred to GI. We will decrease your Wellbutrin dose to 150 mg daily. We will check lab work today. If you develop persistent black stools or bright red blood per rectum please be evaluated.  If your abdominal pain worsens please get looked at.

## 2018-01-25 NOTE — Assessment & Plan Note (Addendum)
Chronic intermittent issue.  It sounds as though it may be gastritis or ulcer related given some of her possible bleeding episodes.  No symptoms currently.  She should avoid soda.  Discussed going back on Protonix though she would like to check her GFR first.  We will refer to GI given chronicity and possible melena.  Check CBC.  Given return precautions.

## 2018-01-27 ENCOUNTER — Encounter: Payer: Self-pay | Admitting: Gastroenterology

## 2018-01-28 ENCOUNTER — Encounter: Payer: Self-pay | Admitting: Family Medicine

## 2018-02-02 ENCOUNTER — Ambulatory Visit: Payer: Self-pay | Admitting: Family Medicine

## 2018-02-14 ENCOUNTER — Other Ambulatory Visit: Payer: Self-pay | Admitting: Family Medicine

## 2018-02-14 ENCOUNTER — Encounter: Payer: Self-pay | Admitting: Family Medicine

## 2018-02-15 ENCOUNTER — Encounter: Payer: Self-pay | Admitting: Gastroenterology

## 2018-02-15 ENCOUNTER — Telehealth: Payer: Self-pay | Admitting: Gastroenterology

## 2018-02-15 ENCOUNTER — Ambulatory Visit (INDEPENDENT_AMBULATORY_CARE_PROVIDER_SITE_OTHER): Payer: BLUE CROSS/BLUE SHIELD | Admitting: Gastroenterology

## 2018-02-15 ENCOUNTER — Other Ambulatory Visit: Payer: Self-pay

## 2018-02-15 ENCOUNTER — Other Ambulatory Visit
Admission: RE | Admit: 2018-02-15 | Discharge: 2018-02-15 | Disposition: A | Discharge status: Home / Self Care | Payer: BLUE CROSS/BLUE SHIELD | Source: Ambulatory Visit | Attending: Gastroenterology | Admitting: Gastroenterology

## 2018-02-15 VITALS — BP 135/87 | HR 94 | Resp 18 | Ht 65.0 in | Wt 129.4 lb

## 2018-02-15 DIAGNOSIS — R1011 Right upper quadrant pain: Secondary | ICD-10-CM

## 2018-02-15 DIAGNOSIS — G8929 Other chronic pain: Secondary | ICD-10-CM | POA: Insufficient documentation

## 2018-02-15 NOTE — Progress Notes (Unsigned)
Jacqueline Craig

## 2018-02-15 NOTE — Progress Notes (Signed)
Jacqueline Darby, MD [address]  [place]  [place], [place] [address]  Main: [phone]  Fax: [phone]    Gastroenterology Consultation  Referring Provider:     Leone Haven, MD Primary Care Physician:  Leone Haven, MD Primary Gastroenterologist:  Dr. Cephas Craig Reason for Consultation:     Chronic RUQ pain, rectal bleeding, intermittent dark stools        HPI:   Rika Daughdrill is a 32 y.o. Caucasian female referred by Dr. Caryl Bis, Angela Adam, MD  for consultation & management of chronic right upper quadrant pain. She reports that the initial onset of pain occurred in 2005. Since then, she has been dealing with sporadic, intermittent mild 3-4/10 in severity,dull pain associated with bloating. She reports undergoing EGD in [place] and in 2016, reportedly normal and negative for H. Pylori. She also underwent ultrasound abdomen in 2017 which was normal, HIDA scan was normal as well. She tried Protonix 40 mg and Zantac which intermittently helped. She has history of ADHD, generally a stressful person. 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 She has been on Wellbutrin for smoking cessation and currently being weaned slowly.  She reports 1 episode of rectal bleeding about one year ago. She also has been noticing intermittent black tarry stools which occur about 1-2 days every 2-3 months. Her hemoglobin has been normal in 2016 and most recently in 01/2018. She denies constipation or diarrhea. She denies weight loss, loss of appetite. She does exercise regularly She drinks alcohol socially  NSAIDs: none  Antiplts/Anticoagulants/Anti thrombotics: none  GI Procedures: EGD in 2016 per patient Reportedly normal including H. Pylori  her father passed away from unknown malignancy at age 31  Past Medical History:  Diagnosis Date  . Bilateral bunions   .  Chickenpox   . GERD (gastroesophageal reflux disease)   . High blood pressure   . Raynaud phenomenon   . UTI (lower urinary tract infection)     No past surgical history on file.  Current Outpatient Medications:  .  [START ON 03/27/2018] amphetamine-dextroamphetamine (ADDERALL) 20 MG tablet, Take 1 tablet (20 mg total) by mouth 2 (two) times daily., Disp: 60 tablet, Rfl: 0 .  [START ON 03/27/2018] amphetamine-dextroamphetamine (ADDERALL) 5 MG tablet, Take 1 tablet (5 mg total) by mouth daily., Disp: 30 tablet, Rfl: 0 .  buPROPion (WELLBUTRIN XL) 150 MG 24 hr tablet, Take 1 tablet (150 mg total) by mouth daily., Disp: 90 tablet, Rfl: 1 .  Calcium 500-100 MG-UNIT CHEW, Chew by mouth., Disp: , Rfl:  .  Levonorgestrel (SKYLA) 13.5 MG IUD, by Intrauterine route., Disp: , Rfl:  .  Multiple Vitamins-Minerals (MULTIVITAMIN WITH MINERALS) tablet, Take 1 tablet by mouth daily., Disp: , Rfl:  .  ranitidine (ZANTAC) 150 MG tablet, Take 1 tablet (150 mg total) by mouth 2 (two) times daily., Disp: 60 tablet, Rfl: 1 .  valACYclovir (VALTREX) 1000 MG tablet, Take 2 tablets (2,000 mg total) by mouth 2 (two) times daily. For one day at first sign of lesions., Disp: 10 tablet, Rfl: 1 .  amphetamine-dextroamphetamine (ADDERALL) 5 MG tablet, Take 1 tablet (5 mg total) by mouth daily. (Patient not taking: Reported on 02/15/2018), Disp: 30 tablet, Rfl: 0   Family History  Problem Relation Age of Onset  . Alcoholism Unknown        Parent  . Arthritis Unknown  Grandparent  . Prostate cancer Unknown        Father  . Hyperlipidemia Unknown        Grandparent  . Heart disease Unknown        Grandparent  . Stroke Unknown        Grandparent  . Hypertension Unknown        Grandparent  . Mental illness Unknown        Other relative  . Diabetes Unknown        Grandparent     Social History   Tobacco Use  . Smoking status: Former Research scientist (life sciences)  . Smokeless tobacco: Never Used  Substance Use Topics  .  Alcohol use: Yes    Alcohol/week: 4.2 oz    Types: 7 Standard drinks or equivalent per week  . Drug use: No    Allergies as of 02/15/2018 - Review Complete 02/15/2018  Allergen Reaction Noted  . Ciprofloxacin Palpitations 08/19/2015    Review of Systems:    All systems reviewed and negative except where noted in HPI.   Physical Exam:  BP 135/87 (BP Location: Left Arm, Patient Position: Sitting, Cuff Size: Normal)   Pulse 94   Resp 18   Ht 5\' 5"  (1.651 m)   Wt 129 lb 6.4 oz (58.7 kg)   BMI 21.53 kg/m  No LMP recorded. (Menstrual status: IUD).  General:   Alert,  Well-developed, well-nourished, pleasant and cooperative in NAD Head:  Normocephalic and atraumatic. Eyes:  Sclera clear, no icterus.   Conjunctiva pink. Ears:  Normal auditory acuity. Nose:  No deformity, discharge, or lesions. Mouth:  No deformity or lesions,oropharynx pink & moist. Neck:  Supple; no masses or thyromegaly. Lungs:  Respirations even and unlabored.  Clear throughout to auscultation.   No wheezes, crackles, or rhonchi. No acute distress. Heart:  Regular rate and rhythm; no murmurs, clicks, rubs, or gallops. Abdomen:  Normal bowel sounds. Soft, non-tender and non-distended without masses, hepatosplenomegaly or hernias noted.  No guarding or rebound tenderness.   Rectal: Not performed Msk:  Symmetrical without gross deformities. Good, equal movement & strength bilaterally. Pulses:  Normal pulses noted. Extremities:  No clubbing or edema.  No cyanosis. Neurologic:  Alert and oriented x3;  grossly normal neurologically. Skin:  Intact without significant lesions or rashes. No jaundice. Lymph Nodes:  No significant cervical adenopathy. Psych:  Alert and cooperative. Normal mood and affect.  Imaging Studies: reviewed  Assessment and Plan:   Maghan Jessee is a 32 y.o. Caucasian female with ADHD,anxiety and depression seen in consultation for chronic intermittent and mild right upper quadrant pain  associated with occasional bloating. She does not have other alarm signs or symptoms. Her history of intermittent black tarry stools with a normal hemoglobin is not convincing for me to recommend endoscopic evaluation at this time. I have asked her to take pictures of the bowel movements when she has these episodes  Chronic right upper quadrant pain: Most likely functional dyspepsia in the setting of stress and anxiety - EGD negative in 2016 - Check celiac serologies - Avoid red meat - Maintain a food and symptom diary - Trial of FD guard - discussed with her about trying low-dose amitriptyline if about measures fail   Follow up in 4-6 weeks   Jacqueline Darby, MD

## 2018-02-15 NOTE — Telephone Encounter (Signed)
Jociline from Lab is calling regarding the order its incomplete IGA and tissue  She needs to know which one pt needs please call    cb [phone]   Or [phone]

## 2018-02-17 LAB — CELIAC DISEASE PANEL
Endomysial Ab, IgA: NEGATIVE
IGA: 216 mg/dL (ref 87–352)
Tissue Transglutaminase Ab, IgA: <2 U/mL (ref 0–3)

## 2018-02-17 LAB — TISSUE TRANSGLUTAMINASE, IGA

## 2018-02-20 NOTE — Telephone Encounter (Signed)
Please advise which IGA is needed.  Thanks [employer]

## 2018-03-11 ENCOUNTER — Other Ambulatory Visit: Payer: Self-pay | Admitting: Family Medicine

## 2018-03-27 ENCOUNTER — Ambulatory Visit: Payer: Self-pay | Admitting: Gastroenterology

## 2018-04-12 ENCOUNTER — Telehealth: Payer: Self-pay | Admitting: *Deleted

## 2018-04-12 MED ORDER — AMPHETAMINE-DEXTROAMPHETAMINE 5 MG PO TABS: 5.0000 mg | ORAL_TABLET | Freq: Every day | ORAL | 0 refills | Status: DC

## 2018-04-12 MED ORDER — AMPHETAMINE-DEXTROAMPHETAMINE 20 MG PO TABS: 20.0000 mg | ORAL_TABLET | Freq: Two times a day (BID) | ORAL | 0 refills | Status: DC

## 2018-04-12 NOTE — Telephone Encounter (Signed)
Please advise 

## 2018-04-12 NOTE — Telephone Encounter (Signed)
Copied from [place] [id number]. Topic: General - Other >> Apr 12, 2018 11:12 AM Yvette Rack wrote: Reason for CRM: Pt asked that a message be sent to Dr. Caryl Bis because she recently started a job and she is only off on Fridays. Pt states she needs a refill on both her prescriptions for amphetamine-dextroamphetamine (ADDERALL) 20 MG tablet  and amphetamine-dextroamphetamine (ADDERALL) 5 MG tablet but she can only come in on a Friday. Offered appt for 04/14/18 at 1:15 but she did not accept. Pt is requesting refill and she can call back to schedule an appt. Cb# [phone]

## 2018-04-12 NOTE — Telephone Encounter (Signed)
I have sent refills to her pharmacy.  She needs to schedule follow-up.  Thanks.

## 2018-04-14 NOTE — Telephone Encounter (Signed)
LMTCB. Need to let pt know that Dr. Caryl Bis has refilled her medication but she needs to schedule a follow up with him. PEC may speak with pt.

## 2018-04-18 NOTE — Telephone Encounter (Signed)
Sent mychart message to schedule follow up

## 2018-05-15 ENCOUNTER — Other Ambulatory Visit: Payer: Self-pay | Admitting: Family Medicine

## 2018-05-15 NOTE — Telephone Encounter (Signed)
Copied from [place] [phone]. Topic: Quick Communication - Rx Refill/Question >> May 15, 2018  3:58 PM Nils Flack, Marland Kitchen wrote: Medication: amphetamine-dextroamphetamine (ADDERALL) 20 MG tablet and amphetamine-dextroamphetamine (ADDERALL) 5 MG tablet  Has the patient contacted their pharmacy? No. (Agent: If no, request that the patient contact the pharmacy for the refill.) (Agent: If yes, when and what did the pharmacy advise?)  Preferred Pharmacy (with phone number or street name): [facility]   Agent: Please be advised that RX refills may take up to 3 business days. We ask that you follow-up with your pharmacy.

## 2018-05-15 NOTE — Telephone Encounter (Signed)
Adderall 20mg  refill, last filled on 04/12/18 #60 Adderall 5mg  refill, last filled on 04/12/18 #30  Last OV: 01/25/18 PCP: Dr. Caryl Bis [facility]acy: [facility] in [place]

## 2018-05-16 ENCOUNTER — Encounter: Payer: Self-pay | Admitting: Family Medicine

## 2018-05-19 ENCOUNTER — Other Ambulatory Visit: Payer: Self-pay | Admitting: Family Medicine

## 2018-05-19 ENCOUNTER — Encounter: Payer: Self-pay | Admitting: Family Medicine

## 2018-05-19 MED ORDER — AMPHETAMINE-DEXTROAMPHETAMINE 5 MG PO TABS: 5.0000 mg | ORAL_TABLET | Freq: Every day | ORAL | 0 refills | Status: DC

## 2018-05-19 MED ORDER — AMPHETAMINE-DEXTROAMPHETAMINE 20 MG PO TABS: 20.0000 mg | ORAL_TABLET | Freq: Two times a day (BID) | ORAL | 0 refills | Status: DC

## 2018-05-19 NOTE — Telephone Encounter (Signed)
Last OV 01/25/18 last filled 04/12/18 60 0rf

## 2018-05-19 NOTE — Telephone Encounter (Signed)
rx request 

## 2018-06-05 ENCOUNTER — Encounter: Payer: Self-pay | Admitting: Family Medicine

## 2018-06-06 MED ORDER — BUPROPION HCL ER (XL) 150 MG PO TB24: 150.0000 mg | ORAL_TABLET | Freq: Every day | ORAL | 1 refills | Status: DC

## 2018-06-07 MED FILL — buPROPion HCL ER (XL) 150 M: 150 | 90 days supply | Qty: 90 | Fill #0

## 2018-06-09 ENCOUNTER — Encounter: Payer: Self-pay | Admitting: Family Medicine

## 2018-06-09 ENCOUNTER — Ambulatory Visit: Payer: Self-pay | Admitting: Family Medicine

## 2018-06-13 ENCOUNTER — Encounter: Payer: Self-pay | Admitting: Family Medicine

## 2018-06-13 ENCOUNTER — Ambulatory Visit: Payer: 59 | Admitting: Family Medicine

## 2018-06-13 DIAGNOSIS — F902 Attention-deficit hyperactivity disorder, combined type: Secondary | ICD-10-CM

## 2018-06-13 DIAGNOSIS — F419 Anxiety disorder, unspecified: Secondary | ICD-10-CM

## 2018-06-13 DIAGNOSIS — F329 Major depressive disorder, single episode, unspecified: Secondary | ICD-10-CM

## 2018-06-13 DIAGNOSIS — F32A Depression, unspecified: Secondary | ICD-10-CM

## 2018-06-13 MED ORDER — AMPHETAMINE-DEXTROAMPHETAMINE 20 MG PO TABS: 20.0000 mg | ORAL_TABLET | Freq: Two times a day (BID) | ORAL | 0 refills | Status: DC

## 2018-06-13 MED ORDER — AMPHETAMINE-DEXTROAMPHETAMINE 5 MG PO TABS: 5.0000 mg | ORAL_TABLET | Freq: Every day | ORAL | 0 refills | Status: DC

## 2018-06-13 NOTE — Progress Notes (Signed)
  Tommi Rumps, MD Phone: [phone]  Anyiah Coverdale is a 32 y.o. female who presents today for f/u.  CC: adhd, anxiety/depression  ADHD Medication: adderall Effectiveness: yes Palpitations: no Sleep difficulty: no Appetite suppression: no  Anxiety/depression: Patient notes she does have some work-related anxiety and depression.  She is working at [employer] currently and is looking forward to transitioning back to [employer].  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  No SI.   Social History   Tobacco Use  Smoking Status Former Smoker  Smokeless Tobacco Never Used     ROS see history of present illness  Objective  Physical Exam Vitals:   06/13/18 1341  BP: 100/70  Pulse: 85  Resp: 16  Temp: 98.8 F (37.1 C)  SpO2: 100%    BP Readings from Last 3 Encounters:  06/13/18 100/70  02/15/18 135/87  01/25/18 112/62   Wt Readings from Last 3 Encounters:  06/13/18 128 lb 2 oz (58.1 kg)  02/15/18 129 lb 6.4 oz (58.7 kg)  01/25/18 127 lb 12.8 oz (58 kg)    Physical Exam  Constitutional: No distress.  Cardiovascular: Normal rate, regular rhythm and normal heart sounds.  Pulmonary/Chest: Effort normal and breath sounds normal.  Neurological: She is alert.  Skin: Skin is warm and dry. She is not diaphoretic.     Assessment/Plan: Please see individual problem list.  Anxiety and depression Does have some symptoms regarding work.  No SI.  She wants to continue on Wellbutrin at this time.  Consider discontinuing once her work life is more stable.  Attention deficit hyperactivity disorder (ADHD) Overall doing well.  Adderall refills given.   No orders of the defined types were placed in this encounter.   Meds ordered this encounter  Medications  . amphetamine-dextroamphetamine (ADDERALL) 20 MG tablet    Sig: Take 1 tablet (20 mg total) by mouth 2 (two) times daily.    Dispense:  60 tablet    Refill:  0  . amphetamine-dextroamphetamine (ADDERALL) 5 MG  tablet    Sig: Take 1 tablet (5 mg total) by mouth daily.    Dispense:  30 tablet    Refill:  0  . amphetamine-dextroamphetamine (ADDERALL) 20 MG tablet    Sig: Take 1 tablet (20 mg total) by mouth 2 (two) times daily.    Dispense:  60 tablet    Refill:  0  . amphetamine-dextroamphetamine (ADDERALL) 20 MG tablet    Sig: Take 1 tablet (20 mg total) by mouth 2 (two) times daily.    Dispense:  60 tablet    Refill:  0  . amphetamine-dextroamphetamine (ADDERALL) 5 MG tablet    Sig: Take 1 tablet (5 mg total) by mouth daily.    Dispense:  30 tablet    Refill:  0  . amphetamine-dextroamphetamine (ADDERALL) 5 MG tablet    Sig: Take 1 tablet (5 mg total) by mouth daily.    Dispense:  30 tablet    Refill:  0     Tommi Rumps, MD [place]

## 2018-06-13 NOTE — Patient Instructions (Signed)
Nice to see you. I have refilled your medications.  We will see you back in about 3 months and we can discuss coming off of the Wellbutrin at that time.

## 2018-06-13 NOTE — Assessment & Plan Note (Signed)
Does have some symptoms regarding work.  No SI.  She wants to continue on Wellbutrin at this time.  Consider discontinuing once her work life is more stable.

## 2018-06-13 NOTE — Assessment & Plan Note (Signed)
Overall doing well.  Adderall refills given.

## 2018-06-21 MED FILL — DEXTROAMP-AMPHETAMINE 5 MG: 5 | 30 days supply | Qty: 30 | Fill #0

## 2018-06-21 MED FILL — DEXTROAMP-AMPHETAMIN 20 MG: 20 | 30 days supply | Qty: 60 | Fill #0

## 2018-06-26 ENCOUNTER — Other Ambulatory Visit: Payer: Self-pay

## 2018-06-26 ENCOUNTER — Encounter: Payer: Self-pay | Admitting: Emergency Medicine

## 2018-06-26 DIAGNOSIS — Z5321 Procedure and treatment not carried out due to patient leaving prior to being seen by health care provider: Secondary | ICD-10-CM | POA: Insufficient documentation

## 2018-06-26 DIAGNOSIS — W25XXXA Contact with sharp glass, initial encounter: Secondary | ICD-10-CM | POA: Diagnosis not present

## 2018-06-26 DIAGNOSIS — Y93E5 Activity, floor mopping and cleaning: Secondary | ICD-10-CM | POA: Insufficient documentation

## 2018-06-26 DIAGNOSIS — Y92009 Unspecified place in unspecified non-institutional (private) residence as the place of occurrence of the external cause: Secondary | ICD-10-CM | POA: Insufficient documentation

## 2018-06-26 DIAGNOSIS — S61215A Laceration without foreign body of left ring finger without damage to nail, initial encounter: Secondary | ICD-10-CM | POA: Diagnosis not present

## 2018-06-26 DIAGNOSIS — Y999 Unspecified external cause status: Secondary | ICD-10-CM | POA: Insufficient documentation

## 2018-06-26 NOTE — ED Triage Notes (Addendum)
Patient ambulatory to triage with steady gait, without difficulty or distress noted; approx 1.5" lac to left 4th finger with no active bleeding; st was cleaning a vase and tried to catch it; clean dressing applied

## 2018-06-27 ENCOUNTER — Emergency Department
Admission: EM | Admit: 2018-06-27 | Discharge: 2018-06-27 | Disposition: A | Discharge status: Home / Self Care | Payer: 59 | Attending: Emergency Medicine | Admitting: Emergency Medicine

## 2018-07-24 ENCOUNTER — Encounter: Payer: Self-pay | Admitting: Family Medicine

## 2018-07-24 ENCOUNTER — Telehealth: Payer: Self-pay | Admitting: Family Medicine

## 2018-07-24 MED ORDER — AMPHETAMINE-DEXTROAMPHETAMINE 20 MG PO TABS: 20.0000 mg | ORAL_TABLET | Freq: Two times a day (BID) | ORAL | 0 refills | Status: DC

## 2018-07-24 MED ORDER — AMPHETAMINE-DEXTROAMPHETAMINE 5 MG PO TABS: 5.0000 mg | ORAL_TABLET | Freq: Every day | ORAL | 0 refills | Status: DC

## 2018-07-24 MED ORDER — AMPHETAMINE-DEXTROAMPHETAMINE 5 MG PO TABS
5.0000 mg | ORAL_TABLET | Freq: Every day | ORAL | 0 refills | Status: DC
Start: 2018-08-24 — End: 2018-09-25

## 2018-07-24 NOTE — Telephone Encounter (Signed)
Copied from [place] [phone]. Topic: Quick Communication - See Telephone Encounter >> Jul 24, 2018  2:35 PM Vernona Rieger wrote: CRM for notification. See Telephone encounter for: 07/24/18.  amphetamine-dextroamphetamine (ADDERALL) 20 MG tablet amphetamine-dextroamphetamine (ADDERALL) 5 MG tablet  Patient would like to these to be sent to [facility] instead of the [facility].

## 2018-07-25 NOTE — Telephone Encounter (Signed)
Spoke with Pam at [facility] to see if prescription requests have been received; she states that they were received 07/24/18.

## 2018-07-25 NOTE — Telephone Encounter (Deleted)
Requested Prescriptions  Pending Prescriptions Disp Refills  . amphetamine-dextroamphetamine (ADDERALL) 20 MG tablet 60 tablet 0    Sig: Take 1 tablet (20 mg total) by mouth 2 (two) times daily.     There is no refill protocol information for this order    . amphetamine-dextroamphetamine (ADDERALL) 5 MG tablet 30 tablet 0    Sig: Take 1 tablet (5 mg total) by mouth daily.     There is no refill protocol information for this order

## 2018-08-21 ENCOUNTER — Encounter: Payer: Self-pay | Admitting: Family Medicine

## 2018-09-19 ENCOUNTER — Ambulatory Visit: Payer: Self-pay | Admitting: Family Medicine

## 2018-09-25 ENCOUNTER — Other Ambulatory Visit: Payer: Self-pay | Admitting: Family Medicine

## 2018-09-26 MED ORDER — AMPHETAMINE-DEXTROAMPHETAMINE 5 MG PO TABS: 5.0000 mg | ORAL_TABLET | Freq: Every day | ORAL | 0 refills | Status: DC

## 2018-09-26 MED ORDER — AMPHETAMINE-DEXTROAMPHETAMINE 20 MG PO TABS: 20.0000 mg | ORAL_TABLET | Freq: Two times a day (BID) | ORAL | 0 refills | Status: DC

## 2018-09-26 NOTE — Telephone Encounter (Signed)
Adderall 20mg  Refilled: 08/24/2018  Adderall 5mg  Refilled: 08/24/2018  Last OV: 06/13/2018 Next OV: 10/09/2018

## 2018-09-26 NOTE — Telephone Encounter (Signed)
Controlled substance database reviewed. Sent to pharmacy.   

## 2018-10-09 ENCOUNTER — Telehealth: Payer: Self-pay | Admitting: Family Medicine

## 2018-10-09 ENCOUNTER — Ambulatory Visit (INDEPENDENT_AMBULATORY_CARE_PROVIDER_SITE_OTHER): Payer: 59 | Admitting: Family Medicine

## 2018-10-09 ENCOUNTER — Encounter: Payer: Self-pay | Admitting: Family Medicine

## 2018-10-09 DIAGNOSIS — J069 Acute upper respiratory infection, unspecified: Secondary | ICD-10-CM

## 2018-10-09 DIAGNOSIS — F902 Attention-deficit hyperactivity disorder, combined type: Secondary | ICD-10-CM | POA: Diagnosis not present

## 2018-10-09 MED ORDER — AMPHETAMINE-DEXTROAMPHETAMINE 5 MG PO TABS: 5.0000 mg | ORAL_TABLET | Freq: Every day | ORAL | 0 refills | Status: DC

## 2018-10-09 MED ORDER — AMPHETAMINE-DEXTROAMPHETAMINE 20 MG PO TABS: 20.0000 mg | ORAL_TABLET | Freq: Two times a day (BID) | ORAL | 0 refills | Status: DC

## 2018-10-09 NOTE — Progress Notes (Signed)
Tommi Rumps, MD Phone: [phone]  Jacqueline Craig is a 32 y.o. female who presents today for follow-up.  CC: ADHD, respiratory illness  ADHD: Taking Adderall 20 mg twice daily.  She will take the 5 mg tablets in the evening in place of the 20 mg tablet at times.  No appetite changes, sleep changes, or palpitations.  Respiratory illness: Patient notes about 4 days ago she started with some nausea and vomiting.  The vomit was nonbloody.  She then developed sinus and nasal congestion with sneezing.  She had chills and body aches.  She had fever at first though that is resolved.  She did note some stomach cramping though no other stomach pain.  She has been taking in good fluids.  She has had sick contacts.  She feels as though she is improving.  She has had no additional vomiting or nausea or abdominal cramping.  Her respiratory symptoms are improving overall.  Social History   Tobacco Use  Smoking Status Former Smoker  Smokeless Tobacco Never Used     ROS see history of present illness  Objective  Physical Exam Vitals:   10/09/18 0816  BP: 96/60  Pulse: 66  Temp: 98 F (36.7 C)  SpO2: 99%    BP Readings from Last 3 Encounters:  10/09/18 96/60  06/26/18 131/90  06/13/18 100/70   Wt Readings from Last 3 Encounters:  10/09/18 123 lb 6.4 oz (56 kg)  06/26/18 125 lb (56.7 kg)  06/13/18 128 lb 2 oz (58.1 kg)    Physical Exam Constitutional:      General: She is not in acute distress.    Appearance: She is not diaphoretic.  HENT:     Head: Normocephalic and atraumatic.     Mouth/Throat:     Mouth: Mucous membranes are moist.     Comments: Mild posterior oropharyngeal erythema Eyes:     Conjunctiva/sclera: Conjunctivae normal.     Pupils: Pupils are equal, round, and reactive to light.  Cardiovascular:     Rate and Rhythm: Normal rate and regular rhythm.     Heart sounds: Normal heart sounds.  Pulmonary:     Effort: Pulmonary effort is normal.     Breath  sounds: Normal breath sounds.  Abdominal:     General: Bowel sounds are normal. There is no distension.     Palpations: Abdomen is soft.     Tenderness: There is no abdominal tenderness.  Lymphadenopathy:     Cervical: No cervical adenopathy.  Skin:    General: Skin is warm and dry.  Neurological:     Mental Status: She is alert.      Assessment/Plan: Please see individual problem list.  Attention deficit hyperactivity disorder (ADHD) Adequately controlled.  Continue current regimen.  Refills given.  Controlled substance database reviewed.  Viral upper respiratory illness Symptoms likely related to viral illness.  She has been improving overall.  She has a relatively benign exam.  Discussed monitoring and if she does not continue to improve she will let us know.   Health Maintenance: Patient left the office prior to noting that we do not have a Pap smear on file.  I will have CMA contact the patient to check on this.  No orders of the defined types were placed in this encounter.   Meds ordered this encounter  Medications  . amphetamine-dextroamphetamine (ADDERALL) 20 MG tablet    Sig: Take 1 tablet (20 mg total) by mouth 2 (two) times daily.    Dispense:  60 tablet    Refill:  0  . amphetamine-dextroamphetamine (ADDERALL) 20 MG tablet    Sig: Take 1 tablet (20 mg total) by mouth 2 (two) times daily.    Dispense:  60 tablet    Refill:  0  . amphetamine-dextroamphetamine (ADDERALL) 20 MG tablet    Sig: Take 1 tablet (20 mg total) by mouth 2 (two) times daily.    Dispense:  60 tablet    Refill:  0  . amphetamine-dextroamphetamine (ADDERALL) 5 MG tablet    Sig: Take 1 tablet (5 mg total) by mouth daily.    Dispense:  30 tablet    Refill:  0  . amphetamine-dextroamphetamine (ADDERALL) 5 MG tablet    Sig: Take 1 tablet (5 mg total) by mouth daily.    Dispense:  30 tablet    Refill:  0  . amphetamine-dextroamphetamine (ADDERALL) 5 MG tablet    Sig: Take 1 tablet (5 mg  total) by mouth daily.    Dispense:  30 tablet    Refill:  0     Tommi Rumps, MD [place]

## 2018-10-09 NOTE — Telephone Encounter (Signed)
Please contact the patient and let her know that after she left the office I noted that we do not have a Pap smear on file for her.  Please see when and where last Pap smear was.  Thanks.

## 2018-10-09 NOTE — Assessment & Plan Note (Signed)
Adequately controlled.  Continue current regimen.  Refills given.  Controlled substance database reviewed.

## 2018-10-09 NOTE — Assessment & Plan Note (Signed)
Symptoms likely related to viral illness.  She has been improving overall.  She has a relatively benign exam.  Discussed monitoring and if she does not continue to improve she will let us know.

## 2018-10-09 NOTE — Patient Instructions (Signed)
Nice to see you. Please monitor your respiratory illness and if it worsens please let us know.  If you do not continue to improve please let us know. I refilled your Adderall.

## 2018-10-10 NOTE — Telephone Encounter (Signed)
Noted  

## 2018-10-10 NOTE — Telephone Encounter (Signed)
Called and spoke with patient. Pt stated that her OB -GYN is Benjaman Kindler MD but her last PAP was done 01/26/2018 by NP Glenis Smoker @ [facility] to PCP as an FYI  Now updated in pt's chart.

## 2018-12-12 ENCOUNTER — Other Ambulatory Visit: Payer: Self-pay | Admitting: Family Medicine

## 2019-01-01 ENCOUNTER — Encounter: Payer: Self-pay | Admitting: Family Medicine

## 2019-01-01 ENCOUNTER — Other Ambulatory Visit: Payer: Self-pay

## 2019-01-01 ENCOUNTER — Ambulatory Visit (INDEPENDENT_AMBULATORY_CARE_PROVIDER_SITE_OTHER): Payer: 59 | Admitting: Family Medicine

## 2019-01-01 VITALS — BP 100/76 | HR 79 | Temp 98.4°F | Ht 65.5 in | Wt 129.2 lb

## 2019-01-01 DIAGNOSIS — Z8619 Personal history of other infectious and parasitic diseases: Secondary | ICD-10-CM

## 2019-01-01 DIAGNOSIS — Z13220 Encounter for screening for lipoid disorders: Secondary | ICD-10-CM | POA: Diagnosis not present

## 2019-01-01 DIAGNOSIS — F902 Attention-deficit hyperactivity disorder, combined type: Secondary | ICD-10-CM

## 2019-01-01 DIAGNOSIS — Z0000 Encounter for general adult medical examination without abnormal findings: Secondary | ICD-10-CM | POA: Insufficient documentation

## 2019-01-01 MED ORDER — AMPHETAMINE-DEXTROAMPHETAMINE 5 MG PO TABS: 5.0000 mg | ORAL_TABLET | Freq: Every day | ORAL | 0 refills | Status: DC

## 2019-01-01 MED ORDER — AMPHETAMINE-DEXTROAMPHETAMINE 20 MG PO TABS: 20.0000 mg | ORAL_TABLET | Freq: Two times a day (BID) | ORAL | 0 refills | Status: DC

## 2019-01-01 MED ORDER — VALACYCLOVIR HCL 1 G PO TABS: 2000.0000 mg | ORAL_TABLET | Freq: Two times a day (BID) | ORAL | 1 refills | Status: DC

## 2019-01-01 NOTE — Patient Instructions (Signed)
Nice to see you. We will get labs. Please stay active. Please send [place] your tetanus vaccine records.

## 2019-01-01 NOTE — Assessment & Plan Note (Signed)
Physical exam completed.  Encouraged continued healthy eating and exercise.  Pap smear up-to-date.  She will keep her upcoming appointment with gynecology.  She will contact us with her tetanus vaccine results.  Lab work as outlined below.

## 2019-01-01 NOTE — Assessment & Plan Note (Signed)
Refill Valtrex

## 2019-01-01 NOTE — Assessment & Plan Note (Addendum)
She was doing well with regards to this issue.  Medication refilled.

## 2019-01-01 NOTE — Progress Notes (Signed)
Eric Sonnenberg, MD Phone: [phone]  Jacqueline Craig is a 33 y.o. female who presents today for CPE.  Exercise: She has been running stairs for 30 minutes most days. Diet is healthy with fish and chicken and vegetables.  She does have a sweet tooth. Pap smear up-to-date 08/20/2016.  Negative cells and HPV.  She does report an abnormal Pap smear in 2014 with HPV.  She had a colposcopy which was negative.  She reports negative Pap smears x2 following that prior to the most recent normal Pap smear.  She does not have consistent menstrual cycles given that she has an IUD in place. No family history of breast cancer, ovarian cancer, or colon cancer. She reports having had a tetanus vaccine sometime last 10 years.  She will contact us with a date. Flu vaccine up-to-date through work. She does note prior HIV screening. No tobacco use or illicit drug use.  She has 1 glass of wine a few times a week. She sees a dentist twice yearly.  Ophthalmologist once yearly.  Active Ambulatory Problems    Diagnosis Date Noted  . Attention deficit hyperactivity disorder (ADHD) 08/19/2015  . Right upper quadrant pain 09/17/2015  . Palpitations 09/17/2015  . H/O cold sores 04/13/2016  . Anxiety and depression 01/25/2018  . Routine general medical examination at a health care facility 01/01/2019   Resolved Ambulatory Problems    Diagnosis Date Noted  . Viral upper respiratory illness 08/19/2015  . History of tobacco abuse 09/17/2015  . Pain in the chest 02/11/2016   Past Medical History:  Diagnosis Date  . Bilateral bunions   . Chickenpox   . GERD (gastroesophageal reflux disease)   . High blood pressure   . Raynaud phenomenon   . UTI (lower urinary tract infection)     Family History  Problem Relation Age of Onset  . Alcoholism Unknown        Parent  . Arthritis Unknown        Grandparent  . Prostate cancer Unknown        Father  . Hyperlipidemia Unknown        Grandparent  . Heart  disease Unknown        Grandparent  . Stroke Unknown        Grandparent  . Hypertension Unknown        Grandparent  . Mental illness Unknown        Other relative  . Diabetes Unknown        Grandparent    Social History   Socioeconomic History  . Marital status: Married    Spouse name: Not on file  . Number of children: Not on file  . Years of education: Not on file  . Highest education level: Not on file  Occupational History  . Not on file  Social Needs  . Financial resource strain: Not on file  . Food insecurity:    Worry: Not on file    Inability: Not on file  . Transportation needs:    Medical: Not on file    Non-medical: Not on file  Tobacco Use  . Smoking status: Former Smoker  . Smokeless tobacco: Never Used  Substance and Sexual Activity  . Alcohol use: Yes    Alcohol/week: 7.0 standard drinks    Types: 7 Standard drinks or equivalent per week  . Drug use: No  . Sexual activity: Not on file  Lifestyle  . Physical activity:    Days per week: Not on   file    Minutes per session: Not on file  . Stress: Not on file  Relationships  . Social connections:    Talks on phone: Not on file    Gets together: Not on file    Attends religious service: Not on file    Active member of club or organization: Not on file    Attends meetings of clubs or organizations: Not on file    Relationship status: Not on file  . Intimate partner violence:    Fear of current or ex partner: Not on file    Emotionally abused: Not on file    Physically abused: Not on file    Forced sexual activity: Not on file  Other Topics Concern  . Not on file  Social History Narrative  . Not on file    ROS  General:  Negative for nexplained weight loss, fever Skin: Negative for new or changing mole, sore that won't heal HEENT: Negative for trouble hearing, trouble seeing, ringing in ears, mouth sores, hoarseness, change in voice, dysphagia. CV:  Negative for chest pain, dyspnea, edema,  palpitations Resp: Negative for cough, dyspnea, hemoptysis GI: Negative for nausea, vomiting, diarrhea, constipation, abdominal pain, melena, hematochezia. GU: Negative for dysuria, incontinence, urinary hesitance, hematuria, vaginal or penile discharge, polyuria, sexual difficulty, lumps in testicle or breasts MSK: Negative for muscle cramps or aches, joint pain or swelling Neuro: Negative for headaches, weakness, numbness, dizziness, passing out/fainting Psych: Negative for depression, anxiety, memory problems  Objective  Physical Exam Vitals:   01/01/19 1442  BP: 100/76  Pulse: 79  Temp: 98.4 F (36.9 C)  SpO2: 99%    BP Readings from Last 3 Encounters:  01/01/19 100/76  10/09/18 96/60  06/26/18 131/90   Wt Readings from Last 3 Encounters:  01/01/19 129 lb 3.2 oz (58.6 kg)  10/09/18 123 lb 6.4 oz (56 kg)  06/26/18 125 lb (56.7 kg)    Physical Exam Constitutional:      General: She is not in acute distress.    Appearance: She is not diaphoretic.  HENT:     Head: Normocephalic and atraumatic.     Mouth/Throat:     Mouth: Mucous membranes are moist.     Pharynx: Oropharynx is clear.  Eyes:     Conjunctiva/sclera: Conjunctivae normal.     Pupils: Pupils are equal, round, and reactive to light.  Cardiovascular:     Rate and Rhythm: Normal rate and regular rhythm.     Heart sounds: Normal heart sounds.  Pulmonary:     Effort: Pulmonary effort is normal.     Breath sounds: Normal breath sounds.  Abdominal:     General: Bowel sounds are normal. There is no distension.     Palpations: Abdomen is soft.     Tenderness: There is no abdominal tenderness. There is no guarding or rebound.  Musculoskeletal:     Right lower leg: No edema.  Lymphadenopathy:     Cervical: No cervical adenopathy.  Skin:    General: Skin is warm and dry.  Neurological:     Mental Status: She is alert.  Psychiatric:        Mood and Affect: Mood normal.      Assessment/Plan:   Routine  general medical examination at a health care facility Physical exam completed.  Encouraged continued healthy eating and exercise.  Pap smear up-to-date.  She will keep her upcoming appointment with gynecology.  She will contact us with her tetanus vaccine results.  Lab  work as outlined below.  Attention deficit hyperactivity disorder (ADHD) She was doing well with regards to this issue.  Medication refilled.  H/O cold sores Refill Valtrex.   Orders Placed This Encounter  Procedures  . Lipid panel  . Comp Met (CMET)    Meds ordered this encounter  Medications  . amphetamine-dextroamphetamine (ADDERALL) 20 MG tablet    Sig: Take 1 tablet (20 mg total) by mouth 2 (two) times daily.    Dispense:  60 tablet    Refill:  0  . amphetamine-dextroamphetamine (ADDERALL) 20 MG tablet    Sig: Take 1 tablet (20 mg total) by mouth 2 (two) times daily.    Dispense:  60 tablet    Refill:  0  . amphetamine-dextroamphetamine (ADDERALL) 20 MG tablet    Sig: Take 1 tablet (20 mg total) by mouth 2 (two) times daily.    Dispense:  60 tablet    Refill:  0  . amphetamine-dextroamphetamine (ADDERALL) 5 MG tablet    Sig: Take 1 tablet (5 mg total) by mouth daily.    Dispense:  30 tablet    Refill:  0  . amphetamine-dextroamphetamine (ADDERALL) 5 MG tablet    Sig: Take 1 tablet (5 mg total) by mouth daily.    Dispense:  30 tablet    Refill:  0  . amphetamine-dextroamphetamine (ADDERALL) 5 MG tablet    Sig: Take 1 tablet (5 mg total) by mouth daily.    Dispense:  30 tablet    Refill:  0  . valACYclovir (VALTREX) 1000 MG tablet    Sig: Take 2 tablets (2,000 mg total) by mouth 2 (two) times daily. For one day at first sign of lesions.    Dispense:  10 tablet    Refill:  1     Eric Sonnenberg, MD [facility] [place]  

## 2019-01-02 LAB — COMPREHENSIVE METABOLIC PANEL
ALT: 23 U/L (ref 0–35)
AST: 22 U/L (ref 0–37)
Albumin: 4.9 g/dL (ref 3.5–5.2)
Alkaline Phosphatase: 57 U/L (ref 39–117)
BUN: 12 mg/dL (ref 6–23)
CALCIUM: 10.5 mg/dL (ref 8.4–10.5)
CO2: 28 meq/L (ref 19–32)
CREATININE: 0.86 mg/dL (ref 0.40–1.20)
Chloride: 101 mEq/L (ref 96–112)
GFR: 75.23 mL/min (ref >60.00)
Glucose, Bld: 91 mg/dL (ref 70–99)
Potassium: 4.3 mEq/L (ref 3.5–5.1)
Sodium: 136 mEq/L (ref 135–145)
Total Bilirubin: 0.6 mg/dL (ref 0.2–1.2)
Total Protein: 7.5 g/dL (ref 6.0–8.3)

## 2019-01-02 LAB — LIPID PANEL
CHOLESTEROL: 159 mg/dL (ref 0–200)
HDL: 56.9 mg/dL (ref >39.00)
LDL Cholesterol: 91 mg/dL (ref 0–99)
NonHDL: 102.54
TRIGLYCERIDES: 57 mg/dL (ref 0.0–149.0)
Total CHOL/HDL Ratio: 3
VLDL: 11.4 mg/dL (ref 0.0–40.0)

## 2019-01-03 ENCOUNTER — Encounter: Payer: Self-pay | Admitting: Family Medicine

## 2019-01-03 NOTE — Telephone Encounter (Signed)
Immunizations put in pt chart and sent to scan. Dajion Bickford,cma

## 2019-01-03 NOTE — Telephone Encounter (Signed)
Received immunization log from pt and put it in chart     Jacqueline Craig,Jacqueline Craig

## 2019-01-07 ENCOUNTER — Encounter: Payer: Self-pay | Admitting: Family Medicine

## 2019-01-15 MED ORDER — BUPROPION HCL ER (XL) 300 MG PO TB24: 300.0000 mg | ORAL_TABLET | Freq: Every day | ORAL | 1 refills | Status: DC

## 2019-01-15 NOTE — Telephone Encounter (Signed)
Sent to PCP ?

## 2019-01-29 ENCOUNTER — Encounter: Payer: Self-pay | Admitting: Family Medicine

## 2019-01-29 MED ORDER — PANTOPRAZOLE SODIUM 40 MG PO TBEC: 40.0000 mg | DELAYED_RELEASE_TABLET | Freq: Every day | ORAL | 3 refills | Status: DC

## 2019-01-29 NOTE — Telephone Encounter (Signed)
Sent to PCP to advise because I know you made some medication changes unsure if you wanted to put her back on this medication.

## 2019-02-28 ENCOUNTER — Telehealth: Payer: 59 | Admitting: Family

## 2019-02-28 DIAGNOSIS — R399 Unspecified symptoms and signs involving the genitourinary system: Secondary | ICD-10-CM

## 2019-02-28 MED ORDER — NITROFURANTOIN MONOHYD MACRO 100 MG PO CAPS: 100.0000 mg | ORAL_CAPSULE | Freq: Two times a day (BID) | ORAL | 0 refills | Status: DC

## 2019-02-28 NOTE — Progress Notes (Signed)
We are sorry that you are not feeling well.  Here is how we plan to help!  Approximately 5 minutes was spent documenting and reviewing patient's chart.    Based on what you shared with me it looks like you most likely have a simple urinary tract infection.  A UTI (Urinary Tract Infection) is a bacterial infection of the bladder.  Most cases of urinary tract infections are simple to treat but a key part of your care is to encourage you to drink plenty of fluids and watch your symptoms carefully.  I have prescribed MacroBid 100 mg twice a day for 5 days.  Your symptoms should gradually improve. Call us if the burning in your urine worsens, you develop worsening fever, back pain or pelvic pain or if your symptoms do not resolve after completing the antibiotic.  Urinary tract infections can be prevented by drinking plenty of water to keep your body hydrated.  Also be sure when you wipe, wipe from front to back and don't hold it in!  If possible, empty your bladder every 4 hours.  Your e-visit answers were reviewed by a board certified advanced clinical practitioner to complete your personal care plan.  Depending on the condition, your plan could have included both over the counter or prescription medications.  If there is a problem please reply  once you have received a response from your provider.  Your safety is important to [place].  If you have drug allergies check your prescription carefully.    You can use MyChart to ask questions about today's visit, request a non-urgent call back, or ask for a work or school excuse for 24 hours related to this e-Visit. If it has been greater than 24 hours you will need to follow up with your provider, or enter a new e-Visit to address those concerns.   You will get an e-mail in the next two days asking about your experience.  I hope that your e-visit has been valuable and will speed your recovery. Thank you for using e-visits.

## 2019-03-03 ENCOUNTER — Encounter: Payer: Self-pay | Admitting: Family Medicine

## 2019-03-03 DIAGNOSIS — R399 Unspecified symptoms and signs involving the genitourinary system: Secondary | ICD-10-CM

## 2019-03-06 ENCOUNTER — Encounter: Payer: Self-pay | Admitting: Family Medicine

## 2019-03-06 ENCOUNTER — Other Ambulatory Visit
Admission: RE | Admit: 2019-03-06 | Discharge: 2019-03-06 | Disposition: A | Discharge status: Home / Self Care | Payer: 59 | Source: Ambulatory Visit | Attending: Family Medicine | Admitting: Family Medicine

## 2019-03-06 DIAGNOSIS — R300 Dysuria: Secondary | ICD-10-CM | POA: Diagnosis not present

## 2019-03-06 NOTE — Telephone Encounter (Signed)
Reviewed. I would like to have a UA with reflex to micro and a culture ordered. She will need to complete the UA prior to me sending in antibiotics. Once I see the UA results I will consider sending in an alternative antibiotic.

## 2019-03-06 NOTE — Addendum Note (Signed)
Addended by: Nanci Pina on: 03/06/2019 04:09 PM   Modules accepted: Orders

## 2019-03-06 NOTE — Telephone Encounter (Signed)
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

## 2019-03-06 NOTE — Telephone Encounter (Signed)
Patient will go to lab at the hospital for UA and culture orders in. FYI

## 2019-03-06 NOTE — Telephone Encounter (Signed)
Patient is going to UC, patient called for appointment but nothing available she is going to UC.

## 2019-03-07 LAB — URINALYSIS, COMPLETE (UACMP) WITH MICROSCOPIC
Bilirubin Urine: NEGATIVE
Glucose, UA: NEGATIVE mg/dL
Hgb urine dipstick: NEGATIVE
Ketones, ur: NEGATIVE mg/dL
Leukocytes,Ua: NEGATIVE
Nitrite: NEGATIVE
Protein, ur: NEGATIVE mg/dL
Specific Gravity, Urine: 1.015 (ref 1.005–1.030)
pH: 5 (ref 5.0–8.0)

## 2019-03-08 LAB — URINE CULTURE

## 2019-03-09 ENCOUNTER — Other Ambulatory Visit
Admission: RE | Admit: 2019-03-09 | Discharge: 2019-03-09 | Disposition: A | Discharge status: Home / Self Care | Payer: 59 | Source: Ambulatory Visit | Attending: Family Medicine | Admitting: Family Medicine

## 2019-03-09 ENCOUNTER — Encounter: Payer: Self-pay | Admitting: Family Medicine

## 2019-03-09 DIAGNOSIS — R300 Dysuria: Secondary | ICD-10-CM

## 2019-03-09 DIAGNOSIS — R399 Unspecified symptoms and signs involving the genitourinary system: Secondary | ICD-10-CM | POA: Insufficient documentation

## 2019-03-10 LAB — URINE CULTURE: Culture: NO GROWTH

## 2019-04-27 ENCOUNTER — Other Ambulatory Visit: Payer: Self-pay | Admitting: Family Medicine

## 2019-04-27 MED ORDER — AMPHETAMINE-DEXTROAMPHETAMINE 20 MG PO TABS: 20.0000 mg | ORAL_TABLET | Freq: Two times a day (BID) | ORAL | 0 refills | Status: DC

## 2019-04-27 MED ORDER — AMPHETAMINE-DEXTROAMPHETAMINE 5 MG PO TABS: 5.0000 mg | ORAL_TABLET | Freq: Every day | ORAL | 0 refills | Status: DC

## 2019-04-27 NOTE — Addendum Note (Signed)
Addended by: Leone Haven on: 04/27/2019 06:46 PM   Modules accepted: Orders

## 2019-05-04 ENCOUNTER — Other Ambulatory Visit: Payer: Self-pay

## 2019-05-04 ENCOUNTER — Encounter: Payer: Self-pay | Admitting: Family Medicine

## 2019-05-04 ENCOUNTER — Ambulatory Visit (INDEPENDENT_AMBULATORY_CARE_PROVIDER_SITE_OTHER): Payer: 59 | Admitting: Family Medicine

## 2019-05-04 DIAGNOSIS — R300 Dysuria: Secondary | ICD-10-CM | POA: Diagnosis not present

## 2019-05-04 DIAGNOSIS — R1011 Right upper quadrant pain: Secondary | ICD-10-CM | POA: Diagnosis not present

## 2019-05-04 DIAGNOSIS — F32A Depression, unspecified: Secondary | ICD-10-CM

## 2019-05-04 DIAGNOSIS — F329 Major depressive disorder, single episode, unspecified: Secondary | ICD-10-CM | POA: Diagnosis not present

## 2019-05-04 DIAGNOSIS — F419 Anxiety disorder, unspecified: Secondary | ICD-10-CM | POA: Diagnosis not present

## 2019-05-04 DIAGNOSIS — F902 Attention-deficit hyperactivity disorder, combined type: Secondary | ICD-10-CM

## 2019-05-04 MED ORDER — AMPHETAMINE-DEXTROAMPHETAMINE 5 MG PO TABS: 5.0000 mg | ORAL_TABLET | Freq: Every day | ORAL | 0 refills | Status: DC

## 2019-05-04 MED ORDER — PANTOPRAZOLE SODIUM 40 MG PO TBEC: 40.0000 mg | DELAYED_RELEASE_TABLET | Freq: Every day | ORAL | 3 refills | Status: DC

## 2019-05-04 MED ORDER — AMPHETAMINE-DEXTROAMPHETAMINE 20 MG PO TABS: 20.0000 mg | ORAL_TABLET | Freq: Two times a day (BID) | ORAL | 0 refills | Status: DC

## 2019-05-04 NOTE — Telephone Encounter (Signed)
Patient called confirmed virtual visit.

## 2019-05-04 NOTE — Progress Notes (Signed)
Virtual Visit via telephone Note  This visit type was conducted due to national recommendations for restrictions regarding the COVID-19 pandemic (e.g. social distancing).  This format is felt to be most appropriate for this patient at this time.  All issues noted in this document were discussed and addressed.  No physical exam was performed (except for noted visual exam findings with Video Visits).   I connected with Jacqueline Craig today at  2:45 PM EDT by telephone and verified that I am speaking with the correct person using two identifiers. Location patient: car  Location provider: work  Persons participating in the virtual visit: patient, provider  I discussed the limitations, risks, security and privacy concerns of performing an evaluation and management service by telephone and the availability of in person appointments. I also discussed with the patient that there may be a patient responsible charge related to this service. The patient expressed understanding and agreed to proceed.  Interactive audio and video telecommunications were attempted between this provider and patient, however failed, due to patient having technical difficulties OR patient did not have access to video capability.  We continued and completed visit with audio only.   Reason for visit: follow-up  HPI: ADHD: Taking Adderall 20 mg twice daily and a 5 mg tablet later in the day.  No appetite suppression, sleep changes, or palpitations.  Dysuria: Previously had issues with dysuria and low back pain.  It eventually did resolve and has not recurred.  Anxiety/depression: Patient notes these things worsened with decreasing the dose of her Wellbutrin.  She increased back to her current dosing and this has improved.  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  No SI.  Heartburn: She has had some increase in this with the increase in stress.  Occasional right  upper quadrant pain with this that has been evaluated previously.  The Protonix resolves these symptoms.  No blood in the stool.  No dysphagia.   ROS: See pertinent positives and negatives per HPI.  Past Medical History:  Diagnosis Date  . Bilateral bunions   . Chickenpox   . GERD (gastroesophageal reflux disease)   . High blood pressure   . Raynaud phenomenon   . UTI (lower urinary tract infection)     No past surgical history on file.  Family History  Problem Relation Age of Onset  . Alcoholism Unknown        Parent  . Arthritis Unknown        Grandparent  . Prostate cancer Unknown        Father  . Hyperlipidemia Unknown        Grandparent  . Heart disease Unknown        Grandparent  . Stroke Unknown        Grandparent  . Hypertension Unknown        Grandparent  . Mental illness Unknown        Other relative  . Diabetes Unknown        Grandparent    SOCIAL HX: Former smoker.   Current Outpatient Medications:  .  amphetamine-dextroamphetamine (ADDERALL) 20 MG tablet, Take 1 tablet (20 mg total) by mouth 2 (two) times daily., Disp: 60 tablet, Rfl: 0 .  amphetamine-dextroamphetamine (ADDERALL) 20 MG tablet, Take 1 tablet (20 mg total) by mouth 2 (two) times daily., Disp: 60 tablet, Rfl: 0 .  amphetamine-dextroamphetamine (ADDERALL) 20 MG tablet, Take 1 tablet (  20 mg total) by mouth 2 (two) times daily., Disp: 60 tablet, Rfl: 0 .  amphetamine-dextroamphetamine (ADDERALL) 5 MG tablet, Take 1 tablet (5 mg total) by mouth daily., Disp: 30 tablet, Rfl: 0 .  amphetamine-dextroamphetamine (ADDERALL) 5 MG tablet, Take 1 tablet (5 mg total) by mouth daily., Disp: 30 tablet, Rfl: 0 .  amphetamine-dextroamphetamine (ADDERALL) 5 MG tablet, Take 1 tablet (5 mg total) by mouth daily., Disp: 30 tablet, Rfl: 0 .  buPROPion (WELLBUTRIN XL) 300 MG 24 hr tablet, Take 1 tablet (300 mg total) by mouth daily., Disp: 90 tablet, Rfl: 1 .  Levonorgestrel (SKYLA) 13.5 MG IUD, by Intrauterine  route., Disp: , Rfl:  .  pantoprazole (PROTONIX) 40 MG tablet, Take 1 tablet (40 mg total) by mouth daily., Disp: 30 tablet, Rfl: 3 .  valACYclovir (VALTREX) 1000 MG tablet, Take 2 tablets (2,000 mg total) by mouth 2 (two) times daily. For one day at first sign of lesions., Disp: 10 tablet, Rfl: 1  EXAM: This was a telehealth telephone visit and thus no physical exam was completed.  ASSESSMENT AND PLAN:  Discussed the following assessment and plan:  Anxiety and depression Stable.  She will continue her current regimen and monitor for worsening symptoms.  Attention deficit hyperactivity disorder (ADHD) Stable.  Medication refills given.  Controlled substance database reviewed.  Right upper quadrant pain Likely gastritis and reflux related.  Refill Protonix.  Dysuria Resolved.  Monitor for recurrence.    I discussed the assessment and treatment plan with the patient. The patient was provided an opportunity to ask questions and all were answered. The patient agreed with the plan and demonstrated an understanding of the instructions.   The patient was advised to call back or seek an in-person evaluation if the symptoms worsen or if the condition fails to improve as anticipated.  I provided 9 minutes of non-face-to-face time during this encounter.   Tommi Rumps, MD

## 2019-05-04 NOTE — Assessment & Plan Note (Signed)
Resolved. Monitor for recurrence. 

## 2019-05-04 NOTE — Assessment & Plan Note (Signed)
Likely gastritis and reflux related.  Refill Protonix.

## 2019-05-04 NOTE — Assessment & Plan Note (Signed)
Stable.  Medication refills given.  Controlled substance database reviewed.

## 2019-05-04 NOTE — Assessment & Plan Note (Signed)
Stable.  She will continue her current regimen and monitor for worsening symptoms.

## 2019-08-09 ENCOUNTER — Other Ambulatory Visit: Payer: Self-pay | Admitting: Family Medicine

## 2019-08-10 ENCOUNTER — Other Ambulatory Visit: Payer: Self-pay

## 2019-08-10 ENCOUNTER — Encounter: Payer: Self-pay | Admitting: Family Medicine

## 2019-08-10 ENCOUNTER — Ambulatory Visit (INDEPENDENT_AMBULATORY_CARE_PROVIDER_SITE_OTHER): Payer: 59 | Admitting: Family Medicine

## 2019-08-10 VITALS — Ht 65.0 in | Wt 120.0 lb

## 2019-08-10 DIAGNOSIS — F32A Depression, unspecified: Secondary | ICD-10-CM

## 2019-08-10 DIAGNOSIS — F419 Anxiety disorder, unspecified: Secondary | ICD-10-CM

## 2019-08-10 DIAGNOSIS — R1011 Right upper quadrant pain: Secondary | ICD-10-CM

## 2019-08-10 DIAGNOSIS — F329 Major depressive disorder, single episode, unspecified: Secondary | ICD-10-CM

## 2019-08-10 DIAGNOSIS — F902 Attention-deficit hyperactivity disorder, combined type: Secondary | ICD-10-CM

## 2019-08-10 MED ORDER — AMPHETAMINE-DEXTROAMPHETAMINE 5 MG PO TABS: 5.0000 mg | ORAL_TABLET | Freq: Every day | ORAL | 0 refills | Status: DC

## 2019-08-10 MED ORDER — AMPHETAMINE-DEXTROAMPHETAMINE 20 MG PO TABS: 20.0000 mg | ORAL_TABLET | Freq: Two times a day (BID) | ORAL | 0 refills | Status: DC

## 2019-08-10 MED ORDER — PANTOPRAZOLE SODIUM 40 MG PO TBEC: 40.0000 mg | DELAYED_RELEASE_TABLET | Freq: Every day | ORAL | 1 refills | Status: DC

## 2019-08-10 NOTE — Assessment & Plan Note (Signed)
Depression is well controlled though she is having some issues with anxiety.  She will continue Wellbutrin.  We will put a referral in for therapy.

## 2019-08-10 NOTE — Assessment & Plan Note (Signed)
Continue Adderall.  Refills provided.

## 2019-08-10 NOTE — Assessment & Plan Note (Signed)
Likely gastritis versus functional dyspepsia.  She will continue Protonix.  Discussed she could take it twice daily if needed though there are long-term risks related to chronic use of PPIs.

## 2019-08-10 NOTE — Progress Notes (Signed)
Virtual Visit via video Note  This visit type was conducted due to national recommendations for restrictions regarding the COVID-19 pandemic (e.g. social distancing).  This format is felt to be most appropriate for this patient at this time.  All issues noted in this document were discussed and addressed.  No physical exam was performed (except for noted visual exam findings with Video Visits).   I connected with Jacqueline Craig today at  2:45 PM EDT by a video enabled telemedicine application and verified that I am speaking with the correct person using two identifiers. Location patient: home Location provider: work Persons participating in the virtual visit: patient, provider  I discussed the limitations, risks, security and privacy concerns of performing an evaluation and management service by telephone and the availability of in person appointments. I also discussed with the patient that there may be a patient responsible charge related to this service. The patient expressed understanding and agreed to proceed.   Reason for visit: follow-up  HPI: ADHD Medication: Taking Adderall with good benefit Effectiveness: Yes Palpitations: No Sleep difficulty: No Appetite suppression: No  Anxiety/depression: Patient has no depression.  She does note anxiety and stress has been an issue.  She is back in the hospital at work.  They are changing out hospitalist groups as well which has been stressful.  She has been trying to do a rowing machine for exercise and she is working with her supervisor at work to help with some of the stressors.  She has been on SSRIs previously.  She has done therapy though it has been many years ago.  No SI.  Right upper quadrant pain: This is a chronic issue.  Felt to be functional dyspepsia by GI.  Protonix has been helpful.  She does still occasionally have discomfort though it improves with twice daily Protonix.  She tries to only take it once daily.  No reflux.   No blood in her stool.    ROS: See pertinent positives and negatives per HPI.  Past Medical History:  Diagnosis Date   Bilateral bunions    Chickenpox    GERD (gastroesophageal reflux disease)    High blood pressure    Raynaud phenomenon    UTI (lower urinary tract infection)     No past surgical history on file.  Family History  Problem Relation Age of Onset   Alcoholism Unknown        Parent   Arthritis Unknown        Grandparent   Prostate cancer Unknown        Father   Hyperlipidemia Unknown        Grandparent   Heart disease Unknown        Grandparent   Stroke Unknown        Grandparent   Hypertension Unknown        Grandparent   Mental illness Unknown        Other relative   Diabetes Unknown        Grandparent    SOCIAL HX: Former smoker.   Current Outpatient Medications:    [START ON 10/31/2019] amphetamine-dextroamphetamine (ADDERALL) 20 MG tablet, Take 1 tablet (20 mg total) by mouth 2 (two) times daily., Disp: 60 tablet, Rfl: 0   [START ON 09/30/2019] amphetamine-dextroamphetamine (ADDERALL) 20 MG tablet, Take 1 tablet (20 mg total) by mouth 2 (two) times daily., Disp: 60 tablet, Rfl: 0   [START ON 08/31/2019] amphetamine-dextroamphetamine (ADDERALL) 20 MG tablet, Take 1 tablet (20 mg total)  by mouth 2 (two) times daily., Disp: 60 tablet, Rfl: 0   [START ON 10/31/2019] amphetamine-dextroamphetamine (ADDERALL) 5 MG tablet, Take 1 tablet (5 mg total) by mouth daily., Disp: 30 tablet, Rfl: 0   [START ON 09/30/2019] amphetamine-dextroamphetamine (ADDERALL) 5 MG tablet, Take 1 tablet (5 mg total) by mouth daily., Disp: 30 tablet, Rfl: 0   [START ON 08/31/2019] amphetamine-dextroamphetamine (ADDERALL) 5 MG tablet, Take 1 tablet (5 mg total) by mouth daily., Disp: 30 tablet, Rfl: 0   buPROPion (WELLBUTRIN XL) 300 MG 24 hr tablet, TAKE 1 TABLET BY MOUTH DAILY., Disp: 90 tablet, Rfl: 1   Levonorgestrel (SKYLA) 13.5 MG IUD, by Intrauterine  route., Disp: , Rfl:    pantoprazole (PROTONIX) 40 MG tablet, Take 1 tablet (40 mg total) by mouth daily., Disp: 90 tablet, Rfl: 1   valACYclovir (VALTREX) 1000 MG tablet, Take 2 tablets (2,000 mg total) by mouth 2 (two) times daily. For one day at first sign of lesions., Disp: 10 tablet, Rfl: 1  EXAM:  VITALS per patient if applicable: None.  GENERAL: alert, oriented, appears well and in no acute distress  HEENT: atraumatic, conjunttiva clear, no obvious abnormalities on inspection of external nose and ears  NECK: normal movements of the head and neck  LUNGS: on inspection no signs of respiratory distress, breathing rate appears normal, no obvious gross SOB, gasping or wheezing  CV: no obvious cyanosis  MS: moves all visible extremities without noticeable abnormality  PSYCH/NEURO: pleasant and cooperative, no obvious depression or anxiety, speech and thought processing grossly intact  ASSESSMENT AND PLAN:  Discussed the following assessment and plan:  Anxiety and depression Depression is well controlled though she is having some issues with anxiety.  She will continue Wellbutrin.  We will put a referral in for therapy.  Attention deficit hyperactivity disorder (ADHD) Continue Adderall.  Refills provided.  Right upper quadrant pain Likely gastritis versus functional dyspepsia.  She will continue Protonix.  Discussed she could take it twice daily if needed though there are long-term risks related to chronic use of PPIs.    I discussed the assessment and treatment plan with the patient. The patient was provided an opportunity to ask questions and all were answered. The patient agreed with the plan and demonstrated an understanding of the instructions.   The patient was advised to call back or seek an in-person evaluation if the symptoms worsen or if the condition fails to improve as anticipated.  Tommi Rumps, MD

## 2019-12-02 ENCOUNTER — Other Ambulatory Visit: Payer: Self-pay | Admitting: Family Medicine

## 2019-12-02 ENCOUNTER — Encounter: Payer: Self-pay | Admitting: Family Medicine

## 2019-12-02 DIAGNOSIS — F902 Attention-deficit hyperactivity disorder, combined type: Secondary | ICD-10-CM

## 2019-12-03 ENCOUNTER — Other Ambulatory Visit: Payer: Self-pay | Admitting: Family Medicine

## 2019-12-03 DIAGNOSIS — F902 Attention-deficit hyperactivity disorder, combined type: Secondary | ICD-10-CM

## 2019-12-03 MED ORDER — AMPHETAMINE-DEXTROAMPHETAMINE 20 MG PO TABS: 20.0000 mg | ORAL_TABLET | Freq: Two times a day (BID) | ORAL | 0 refills | Status: DC

## 2019-12-03 MED ORDER — AMPHETAMINE-DEXTROAMPHETAMINE 5 MG PO TABS: 5.0000 mg | ORAL_TABLET | Freq: Every day | ORAL | 0 refills | Status: DC

## 2019-12-03 NOTE — Telephone Encounter (Signed)
Will not allow me to delete request. Only a provider can do so. You have already filled today. Diplicate

## 2019-12-03 NOTE — Telephone Encounter (Signed)
Adderall 5mg    Refilled: 10/31/2019 Adderall 20mg    Refilled: 10/31/2019  Last OV: 08/10/2019 Next OV: 01/02/2020

## 2019-12-04 ENCOUNTER — Encounter: Payer: Self-pay | Admitting: Family Medicine

## 2019-12-28 ENCOUNTER — Other Ambulatory Visit: Payer: Self-pay | Admitting: Family Medicine

## 2019-12-28 DIAGNOSIS — F902 Attention-deficit hyperactivity disorder, combined type: Secondary | ICD-10-CM

## 2019-12-31 ENCOUNTER — Other Ambulatory Visit: Payer: Self-pay | Admitting: Family Medicine

## 2019-12-31 DIAGNOSIS — F902 Attention-deficit hyperactivity disorder, combined type: Secondary | ICD-10-CM

## 2019-12-31 MED ORDER — AMPHETAMINE-DEXTROAMPHETAMINE 20 MG PO TABS: 20.0000 mg | ORAL_TABLET | Freq: Two times a day (BID) | ORAL | 0 refills | Status: DC

## 2019-12-31 MED ORDER — AMPHETAMINE-DEXTROAMPHETAMINE 5 MG PO TABS: 5.0000 mg | ORAL_TABLET | Freq: Every day | ORAL | 0 refills | Status: DC

## 2020-01-02 ENCOUNTER — Other Ambulatory Visit: Payer: Self-pay

## 2020-01-02 ENCOUNTER — Telehealth (INDEPENDENT_AMBULATORY_CARE_PROVIDER_SITE_OTHER): Payer: 59 | Admitting: Family Medicine

## 2020-01-02 ENCOUNTER — Encounter: Payer: Self-pay | Admitting: Family Medicine

## 2020-01-02 DIAGNOSIS — F419 Anxiety disorder, unspecified: Secondary | ICD-10-CM

## 2020-01-02 DIAGNOSIS — F902 Attention-deficit hyperactivity disorder, combined type: Secondary | ICD-10-CM | POA: Diagnosis not present

## 2020-01-02 DIAGNOSIS — F32A Depression, unspecified: Secondary | ICD-10-CM

## 2020-01-02 DIAGNOSIS — R1011 Right upper quadrant pain: Secondary | ICD-10-CM | POA: Diagnosis not present

## 2020-01-02 DIAGNOSIS — F329 Major depressive disorder, single episode, unspecified: Secondary | ICD-10-CM | POA: Diagnosis not present

## 2020-01-02 MED ORDER — AMPHETAMINE-DEXTROAMPHETAMINE 5 MG PO TABS: 5.0000 mg | ORAL_TABLET | Freq: Every day | ORAL | 0 refills | Status: DC

## 2020-01-02 MED ORDER — AMPHETAMINE-DEXTROAMPHETAMINE 20 MG PO TABS: 20.0000 mg | ORAL_TABLET | Freq: Two times a day (BID) | ORAL | 0 refills | Status: DC

## 2020-01-02 NOTE — Assessment & Plan Note (Signed)
Chronic issue.  Discussed trial of FDgard.  Continue as needed Protonix.

## 2020-01-02 NOTE — Assessment & Plan Note (Signed)
Stable.  Continue current regimen.  Refills given.

## 2020-01-02 NOTE — Assessment & Plan Note (Signed)
Stable.  Continue Wellbutrin.

## 2020-01-02 NOTE — Progress Notes (Signed)
Virtual Visit via video Note  This visit type was conducted due to national recommendations for restrictions regarding the COVID-19 pandemic (e.g. social distancing).  This format is felt to be most appropriate for this patient at this time.  All issues noted in this document were discussed and addressed.  No physical exam was performed (except for noted visual exam findings with Video Visits).   I connected with Jacqueline Craig today at 10:30 AM EDT by a video enabled telemedicine application and verified that I am speaking with the correct person using two identifiers. Location patient: work Location provider: work Persons participating in the virtual visit: patient, provider  I discussed the limitations, risks, security and privacy concerns of performing an evaluation and management service by telephone and the availability of in person appointments. I also discussed with the patient that there may be a patient responsible charge related to this service. The patient expressed understanding and agreed to proceed.  Reason for visit: follow-up  HPI: ADHD Medication: adderall 20 MG BID and adderall 5 mg once daily Effectiveness: yes Palpitations: no Sleep difficulty: no Appetite suppression: no  Anxiety/depression: She notes some anxiety with clinic though nothing out of what she would expect.  No depression.  Taking Wellbutrin.  She never did therapy.  Chronic right upper quadrant pain: Notes this is a chronic intermittent issue.  She did see GI previously.  She has had a negative EGD and negative celiac serologies.  She did not try FDgard previously.  She does occasionally take Protonix though only if the discomfort has been bothering her multiple times in a row.  She has not identified any specific causes.    ROS: See pertinent positives and negatives per HPI.  Past Medical History:  Diagnosis Date  . Bilateral bunions   . Chickenpox   . GERD (gastroesophageal reflux disease)    . High blood pressure   . Raynaud phenomenon   . UTI (lower urinary tract infection)     No past surgical history on file.  Family History  Problem Relation Age of Onset  . Alcoholism Unknown        Parent  . Arthritis Unknown        Grandparent  . Prostate cancer Unknown        Father  . Hyperlipidemia Unknown        Grandparent  . Heart disease Unknown        Grandparent  . Stroke Unknown        Grandparent  . Hypertension Unknown        Grandparent  . Mental illness Unknown        Other relative  . Diabetes Unknown        Grandparent    SOCIAL HX: Former smoker   Current Outpatient Medications:  .  amphetamine-dextroamphetamine (ADDERALL) 20 MG tablet, Take 1 tablet (20 mg total) by mouth 2 (two) times daily., Disp: 60 tablet, Rfl: 0 .  amphetamine-dextroamphetamine (ADDERALL) 20 MG tablet, Take 1 tablet (20 mg total) by mouth 2 (two) times daily., Disp: 60 tablet, Rfl: 0 .  amphetamine-dextroamphetamine (ADDERALL) 20 MG tablet, Take 1 tablet (20 mg total) by mouth 2 (two) times daily., Disp: 60 tablet, Rfl: 0 .  amphetamine-dextroamphetamine (ADDERALL) 5 MG tablet, Take 1 tablet (5 mg total) by mouth daily., Disp: 30 tablet, Rfl: 0 .  amphetamine-dextroamphetamine (ADDERALL) 5 MG tablet, Take 1 tablet (5 mg total) by mouth daily., Disp: 30 tablet, Rfl: 0 .  amphetamine-dextroamphetamine (ADDERALL) 5  MG tablet, Take 1 tablet (5 mg total) by mouth daily., Disp: 30 tablet, Rfl: 0 .  buPROPion (WELLBUTRIN XL) 300 MG 24 hr tablet, TAKE 1 TABLET BY MOUTH DAILY., Disp: 90 tablet, Rfl: 1 .  Levonorgestrel (SKYLA) 13.5 MG IUD, by Intrauterine route., Disp: , Rfl:  .  pantoprazole (PROTONIX) 40 MG tablet, Take 1 tablet (40 mg total) by mouth daily., Disp: 90 tablet, Rfl: 1 .  valACYclovir (VALTREX) 1000 MG tablet, Take 2 tablets (2,000 mg total) by mouth 2 (two) times daily. For one day at first sign of lesions., Disp: 10 tablet, Rfl: 1  EXAM:  VITALS per patient if  applicable:  GENERAL: alert, oriented, appears well and in no acute distress  HEENT: atraumatic, conjunttiva clear, no obvious abnormalities on inspection of external nose and ears  NECK: normal movements of the head and neck  LUNGS: on inspection no signs of respiratory distress, breathing rate appears normal, no obvious gross SOB, gasping or wheezing  CV: no obvious cyanosis  MS: moves all visible extremities without noticeable abnormality  PSYCH/NEURO: pleasant and cooperative, no obvious depression or anxiety, speech and thought processing grossly intact  ASSESSMENT AND PLAN:  Discussed the following assessment and plan:  Anxiety and depression Stable.  Continue Wellbutrin.  Attention deficit hyperactivity disorder (ADHD) Stable.  Continue current regimen.  Refills given.  Right upper quadrant pain Chronic issue.  Discussed trial of FDgard.  Continue as needed Protonix.   No orders of the defined types were placed in this encounter.   No orders of the defined types were placed in this encounter.    I discussed the assessment and treatment plan with the patient. The patient was provided an opportunity to ask questions and all were answered. The patient agreed with the plan and demonstrated an understanding of the instructions.   The patient was advised to call back or seek an in-person evaluation if the symptoms worsen or if the condition fails to improve as anticipated.  Tommi Rumps, MD

## 2020-01-02 NOTE — Patient Instructions (Signed)
Try FDgard.

## 2020-02-25 ENCOUNTER — Other Ambulatory Visit: Payer: Self-pay | Admitting: Family Medicine

## 2020-03-31 ENCOUNTER — Encounter: Payer: Self-pay | Admitting: Family Medicine

## 2020-03-31 ENCOUNTER — Telehealth: Payer: Self-pay

## 2020-03-31 ENCOUNTER — Other Ambulatory Visit: Payer: Self-pay | Admitting: Family Medicine

## 2020-03-31 ENCOUNTER — Other Ambulatory Visit: Payer: Self-pay

## 2020-03-31 ENCOUNTER — Telehealth (INDEPENDENT_AMBULATORY_CARE_PROVIDER_SITE_OTHER): Payer: 59 | Admitting: Family Medicine

## 2020-03-31 DIAGNOSIS — F902 Attention-deficit hyperactivity disorder, combined type: Secondary | ICD-10-CM

## 2020-03-31 DIAGNOSIS — R1011 Right upper quadrant pain: Secondary | ICD-10-CM

## 2020-03-31 DIAGNOSIS — F439 Reaction to severe stress, unspecified: Secondary | ICD-10-CM

## 2020-03-31 MED ORDER — AMPHETAMINE-DEXTROAMPHETAMINE 20 MG PO TABS: 20.0000 mg | ORAL_TABLET | Freq: Two times a day (BID) | ORAL | 0 refills | Status: DC

## 2020-03-31 MED ORDER — AMPHETAMINE-DEXTROAMPHETAMINE 5 MG PO TABS: 5.0000 mg | ORAL_TABLET | Freq: Every day | ORAL | 0 refills | Status: DC

## 2020-03-31 NOTE — Telephone Encounter (Signed)
Noted. That is fine. I was trying to cover all the days in the month, though potentially her insurance won't like that.

## 2020-03-31 NOTE — Assessment & Plan Note (Signed)
Her cat is currently sick and has a short life expectancy.  The patient know she has been dealing with this fairly well.  Offered support and she will let us know if she needs anything regarding this moving forward.

## 2020-03-31 NOTE — Assessment & Plan Note (Signed)
Stable.  Refill sent to pharmacy.

## 2020-03-31 NOTE — Progress Notes (Signed)
Virtual Visit via telephone Note  This visit type was conducted due to national recommendations for restrictions regarding the COVID-19 pandemic (e.g. social distancing).  This format is felt to be most appropriate for this patient at this time.  All issues noted in this document were discussed and addressed.  No physical exam was performed (except for noted visual exam findings with Video Visits).   I connected with Jacqueline Craig today at  4:00 PM EDT by telephone and verified that I am speaking with the correct person using two identifiers. Location patient: car Location provider: work  Persons participating in the virtual visit: patient, provider  I discussed the limitations, risks, security and privacy concerns of performing an evaluation and management service by telephone and the availability of in person appointments. I also discussed with the patient that there may be a patient responsible charge related to this service. The patient expressed understanding and agreed to proceed.  Interactive audio and video telecommunications were attempted between this provider and patient, however failed, due to patient having technical difficulties OR patient did not have access to video capability.  We continued and completed visit with audio only.  Reason for visit: follow-up  HPI: ADHD: Patient notes this is relatively well controlled now.  Taking Adderall 20 mg twice daily and then 5 mg later in the day.  No appetite suppression, palpitations, or sleep changes.  Stress: Notes some stress as her cat is sick with hypertrophic cardiomyopathy.  She notes the cat is hospitalized currently with flash pulmonary edema.  She notes she has been dealing with this fairly well.  Chronic right upper quadrant pain: This continues to be an intermittent issue.  She takes Protonix as needed for the right upper quadrant pain.  She previously has tried FDgard with no benefit.  She did see GI previously.   Patient wonders if she should have a colonoscopy given the rise in patients in their 66s with colon cancer.  She has not had any blood in her stool in quite a long time.  She will occasionally have a dark stool though it will be a 1 off and will not recur.   ROS: See pertinent positives and negatives per HPI.  Past Medical History:  Diagnosis Date  . Bilateral bunions   . Chickenpox   . GERD (gastroesophageal reflux disease)   . High blood pressure   . Raynaud phenomenon   . UTI (lower urinary tract infection)     No past surgical history on file.  Family History  Problem Relation Age of Onset  . Alcoholism Unknown        Parent  . Arthritis Unknown        Grandparent  . Prostate cancer Unknown        Father  . Hyperlipidemia Unknown        Grandparent  . Heart disease Unknown        Grandparent  . Stroke Unknown        Grandparent  . Hypertension Unknown        Grandparent  . Mental illness Unknown        Other relative  . Diabetes Unknown        Grandparent    SOCIAL HX: former smoker   Current Outpatient Medications:  .  [START ON 05/30/2020] amphetamine-dextroamphetamine (ADDERALL) 20 MG tablet, Take 1 tablet (20 mg total) by mouth 2 (two) times daily., Disp: 60 tablet, Rfl: 0 .  [START ON 04/30/2020] amphetamine-dextroamphetamine (ADDERALL) 20  MG tablet, Take 1 tablet (20 mg total) by mouth 2 (two) times daily., Disp: 60 tablet, Rfl: 0 .  amphetamine-dextroamphetamine (ADDERALL) 20 MG tablet, Take 1 tablet (20 mg total) by mouth 2 (two) times daily., Disp: 60 tablet, Rfl: 0 .  [START ON 05/30/2020] amphetamine-dextroamphetamine (ADDERALL) 5 MG tablet, Take 1 tablet (5 mg total) by mouth daily., Disp: 30 tablet, Rfl: 0 .  [START ON 04/30/2020] amphetamine-dextroamphetamine (ADDERALL) 5 MG tablet, Take 1 tablet (5 mg total) by mouth daily., Disp: 30 tablet, Rfl: 0 .  amphetamine-dextroamphetamine (ADDERALL) 5 MG tablet, Take 1 tablet (5 mg total) by mouth daily., Disp:  30 tablet, Rfl: 0 .  buPROPion (WELLBUTRIN XL) 300 MG 24 hr tablet, TAKE 1 TABLET BY MOUTH DAILY., Disp: 90 tablet, Rfl: 1 .  Levonorgestrel (SKYLA) 13.5 MG IUD, by Intrauterine route., Disp: , Rfl:  .  pantoprazole (PROTONIX) 40 MG tablet, Take 1 tablet (40 mg total) by mouth daily., Disp: 90 tablet, Rfl: 1 .  valACYclovir (VALTREX) 1000 MG tablet, Take 2 tablets (2,000 mg total) by mouth 2 (two) times daily. For one day at first sign of lesions., Disp: 10 tablet, Rfl: 1  EXAM: This was a telephone visit and thus no physical exam was completed.  ASSESSMENT AND PLAN:  Discussed the following assessment and plan:  Right upper quadrant pain Chronic intermittent issue.  She will continue Protonix.  I will send a message to the GI physician who saw her previously to get their input on whether or not the patient needs a colonoscopy.  Attention deficit hyperactivity disorder (ADHD) Stable.  Refill sent to pharmacy.  Stress Her cat is currently sick and has a short life expectancy.  The patient know she has been dealing with this fairly well.  Offered support and she will let us know if she needs anything regarding this moving forward.   No orders of the defined types were placed in this encounter.   Meds ordered this encounter  Medications  . amphetamine-dextroamphetamine (ADDERALL) 20 MG tablet    Sig: Take 1 tablet (20 mg total) by mouth 2 (two) times daily.    Dispense:  60 tablet    Refill:  0  . amphetamine-dextroamphetamine (ADDERALL) 20 MG tablet    Sig: Take 1 tablet (20 mg total) by mouth 2 (two) times daily.    Dispense:  60 tablet    Refill:  0  . amphetamine-dextroamphetamine (ADDERALL) 5 MG tablet    Sig: Take 1 tablet (5 mg total) by mouth daily.    Dispense:  30 tablet    Refill:  0  . amphetamine-dextroamphetamine (ADDERALL) 5 MG tablet    Sig: Take 1 tablet (5 mg total) by mouth daily.    Dispense:  30 tablet    Refill:  0  . amphetamine-dextroamphetamine  (ADDERALL) 5 MG tablet    Sig: Take 1 tablet (5 mg total) by mouth daily.    Dispense:  30 tablet    Refill:  0  . amphetamine-dextroamphetamine (ADDERALL) 20 MG tablet    Sig: Take 1 tablet (20 mg total) by mouth 2 (two) times daily.    Dispense:  60 tablet    Refill:  0     I discussed the assessment and treatment plan with the patient. The patient was provided an opportunity to ask questions and all were answered. The patient agreed with the plan and demonstrated an understanding of the instructions.   The patient was advised to call back or seek  an in-person evaluation if the symptoms worsen or if the condition fails to improve as anticipated.  I provided 11 minutes of non-face-to-face time during this encounter.   Tommi Rumps, MD

## 2020-03-31 NOTE — Assessment & Plan Note (Signed)
Chronic intermittent issue.  She will continue Protonix.  I will send a message to the GI physician who saw her previously to get their input on whether or not the patient needs a colonoscopy.

## 2020-04-01 ENCOUNTER — Telehealth: Payer: Self-pay | Admitting: Family Medicine

## 2020-04-01 ENCOUNTER — Telehealth: Payer: Self-pay

## 2020-04-01 NOTE — Telephone Encounter (Signed)
Called and left a message for call back  

## 2020-04-01 NOTE — Telephone Encounter (Signed)
Please let the patient know that I heard back from GI. They are going to contact her to set up a colonoscopy to evaluate her discomfort and intermittent dark stools.

## 2020-04-01 NOTE — Telephone Encounter (Signed)
-----   Message from Lin Landsman, MD sent at 04/01/2020  6:50 AM EDT ----- Regarding: RE: Sure, we can set up a colonoscopy   Caryl Pina,   Please call her and set up a colonoscopy  Dx: intermittent black stools  Thanks  RV ----- Message ----- From: Leone Haven, MD Sent: 03/31/2020   4:26 PM EDT To: Lin Landsman, MD  Hi Dr Marius Ditch, I saw Mrs Vanloan for follow-up today. She saw you back in 2019 for chronic RUQ pain. She has continued to have this intermittently and it seems to respond to prn protonix. Her history has not changed since that last visit with you as she continues to have intermittent dark stools for a day or so infrequently occurring. She asked today if she should have a colonoscopy to rule out an underlying colon lesion that could be contributing and I wanted to check with you on this. I can have her schedule follow-up with you to discuss if you would like. Thanks for your help.Randall Hiss

## 2020-04-01 NOTE — Telephone Encounter (Signed)
I called and LVM for the patient informing her that she will receive a call from GI to schedule a colonoscopy.  Nia Nathaniel,cma

## 2020-04-01 NOTE — Telephone Encounter (Signed)
Sent mychart message

## 2020-04-01 NOTE — Telephone Encounter (Signed)
Unable to refuse or close note. Please advise

## 2020-04-01 NOTE — Telephone Encounter (Signed)
-----   Message from Lin Landsman, MD sent at 04/01/2020  6:50 AM EDT ----- Regarding: RE: Sure, we can set up a colonoscopy   Caryl Pina,   Please call her and set up a colonoscopy  Dx: intermittent black stools  Thanks  RV ----- Message ----- From: Leone Haven, MD Sent: 03/31/2020   4:26 PM EDT To: Lin Landsman, MD  Hi Dr Marius Ditch, I saw Mrs Krog for follow-up today. She saw you back in 2019 for chronic RUQ pain. She has continued to have this intermittently and it seems to respond to prn protonix. Her history has not changed since that last visit with you as she continues to have intermittent dark stools for a day or so infrequently occurring. She asked today if she should have a colonoscopy to rule out an underlying colon lesion that could be contributing and I wanted to check with you on this. I can have her schedule follow-up with you to discuss if you would like. Thanks for your help.Randall Hiss

## 2020-04-01 NOTE — Telephone Encounter (Signed)
Left voicemail to schedule 3 month in person visit for ADHD

## 2020-04-02 NOTE — Telephone Encounter (Signed)
Called and left a message for call back  

## 2020-04-02 NOTE — Telephone Encounter (Signed)
Called and left a message for call back. Sent a letter.

## 2020-06-11 ENCOUNTER — Encounter: Payer: Self-pay | Admitting: Family Medicine

## 2020-06-11 ENCOUNTER — Other Ambulatory Visit: Payer: Self-pay

## 2020-06-11 DIAGNOSIS — K921 Melena: Secondary | ICD-10-CM

## 2020-06-11 MED ORDER — NA SULFATE-K SULFATE-MG SULF 17.5-3.13-1.6 GM/177ML PO SOLN: 354.0000 mL | Freq: Once | ORAL | 0 refills | Status: AC

## 2020-06-11 NOTE — Telephone Encounter (Signed)
Patient called back and scheduled for 06/30/2020. Went over instructions with patient and sent to [employer]

## 2020-06-23 ENCOUNTER — Other Ambulatory Visit: Payer: Self-pay | Admitting: Family Medicine

## 2020-06-23 ENCOUNTER — Other Ambulatory Visit: Payer: Self-pay

## 2020-06-23 ENCOUNTER — Telehealth (INDEPENDENT_AMBULATORY_CARE_PROVIDER_SITE_OTHER): Payer: 59 | Admitting: Family Medicine

## 2020-06-23 DIAGNOSIS — F902 Attention-deficit hyperactivity disorder, combined type: Secondary | ICD-10-CM | POA: Diagnosis not present

## 2020-06-23 MED ORDER — AMPHETAMINE-DEXTROAMPHET ER 20 MG PO CP24: 40.0000 mg | ORAL_CAPSULE | Freq: Every day | ORAL | 0 refills | Status: DC

## 2020-06-23 MED ORDER — AMPHETAMINE-DEXTROAMPHETAMINE 5 MG PO TABS: 5.0000 mg | ORAL_TABLET | Freq: Every day | ORAL | 0 refills | Status: DC

## 2020-06-23 NOTE — Assessment & Plan Note (Signed)
Patient has had some issues with accomplishing her work in an appropriate amount of time.  Her ADHD may be playing a role in this.  We will try extended release Adderall to see if that will provide additional benefit.  Discussed that she would typically notice some difference within the next week or 2 and if she is not noticing any difference she should let us know.  We will see her back in about a month.  Discussed monitoring for palpitations, appetite suppression, and sleep changes.  If she develops though she will let us know as well.

## 2020-06-23 NOTE — Progress Notes (Signed)
Virtual Visit via video Note  This visit type was conducted due to national recommendations for restrictions regarding the COVID-19 pandemic (e.g. social distancing).  This format is felt to be most appropriate for this patient at this time.  All issues noted in this document were discussed and addressed.  No physical exam was performed (except for noted visual exam findings with Video Visits).   I connected with Jacqueline Craig today at  3:15 PM EDT by a video enabled telemedicine application or telephone and verified that I am speaking with the correct person using two identifiers. Location patient: car Location provider: work Persons participating in the virtual visit: patient, provider  I discussed the limitations, risks, security and privacy concerns of performing an evaluation and management service by telephone and the availability of in person appointments. I also discussed with the patient that there may be a patient responsible charge related to this service. The patient expressed understanding and agreed to proceed.   Reason for visit: f/u  HPI: ADHD: Patient feels as though her Adderall may not be as beneficial as it had been in the past.  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  She is fidgeting with her leg more than typical.  She feels as though she has been focused though has had some issues accomplishing the work in the timeframe that they want.  She has trouble with the speed of the visit and getting her notes done right after the visit.  They did give her some pointers on cutting off the patient when discussing anything that a PCP could handle and they also encouraged her to prenote.  She notes no appetite suppression, sleep changes, or palpitations with her Adderall.  In the past she was on Ritalin and Concerta and noted that gave her headaches.  She wonders about  extended release Adderall.   ROS: See pertinent positives and negatives per HPI.  Past Medical History:  Diagnosis Date  . Bilateral bunions   . Chickenpox   . GERD (gastroesophageal reflux disease)   . High blood pressure   . Raynaud phenomenon   . UTI (lower urinary tract infection)     No past surgical history on file.  Family History  Problem Relation Age of Onset  . Alcoholism Unknown        Parent  . Arthritis Unknown        Grandparent  . Prostate cancer Unknown        Father  . Hyperlipidemia Unknown        Grandparent  . Heart disease Unknown        Grandparent  . Stroke Unknown        Grandparent  . Hypertension Unknown        Grandparent  . Mental illness Unknown        Other relative  . Diabetes Unknown        Grandparent    SOCIAL HX: Former smoker   Current Outpatient Medications:  .  amphetamine-dextroamphetamine (ADDERALL XR) 20 MG 24 hr capsule, Take 2 capsules (40 mg total) by mouth daily., Disp: 60 capsule, Rfl: 0 .  [START ON 08/23/2020] amphetamine-dextroamphetamine (ADDERALL) 5 MG tablet, Take 1 tablet (5 mg total) by mouth daily., Disp: 30 tablet, Rfl: 0 .  [START ON 07/23/2020] amphetamine-dextroamphetamine (ADDERALL) 5 MG tablet, Take 1 tablet (5 mg total) by mouth daily.,  Disp: 30 tablet, Rfl: 0 .  amphetamine-dextroamphetamine (ADDERALL) 5 MG tablet, Take 1 tablet (5 mg total) by mouth daily., Disp: 30 tablet, Rfl: 0 .  buPROPion (WELLBUTRIN XL) 300 MG 24 hr tablet, TAKE 1 TABLET BY MOUTH DAILY., Disp: 90 tablet, Rfl: 1 .  Levonorgestrel (SKYLA) 13.5 MG IUD, by Intrauterine route., Disp: , Rfl:  .  pantoprazole (PROTONIX) 40 MG tablet, Take 1 tablet (40 mg total) by mouth daily., Disp: 90 tablet, Rfl: 1 .  valACYclovir (VALTREX) 1000 MG tablet, Take 2 tablets (2,000 mg total) by mouth 2 (two) times daily. For one day at first sign of lesions., Disp: 10 tablet, Rfl: 1  EXAM:  VITALS per patient if applicable:  GENERAL: alert, oriented,  appears well and in no acute distress  HEENT: atraumatic, conjunttiva clear, no obvious abnormalities on inspection of external nose and ears  NECK: normal movements of the head and neck  LUNGS: on inspection no signs of respiratory distress, breathing rate appears normal, no obvious gross SOB, gasping or wheezing  CV: no obvious cyanosis  MS: moves all visible extremities without noticeable abnormality  PSYCH/NEURO: pleasant and cooperative, no obvious depression or anxiety, speech and thought processing grossly intact  ASSESSMENT AND PLAN:  Discussed the following assessment and plan:  Attention deficit hyperactivity disorder (ADHD) Patient has had some issues with accomplishing her work in an appropriate amount of time.  Her ADHD may be playing a role in this.  We will try extended release Adderall to see if that will provide additional benefit.  Discussed that she would typically notice some difference within the next week or 2 and if she is not noticing any difference she should let us know.  We will see her back in about a month.  Discussed monitoring for palpitations, appetite suppression, and sleep changes.  If she develops though she will let us know as well.   No orders of the defined types were placed in this encounter.   Meds ordered this encounter  Medications  . amphetamine-dextroamphetamine (ADDERALL XR) 20 MG 24 hr capsule    Sig: Take 2 capsules (40 mg total) by mouth daily.    Dispense:  60 capsule    Refill:  0  . amphetamine-dextroamphetamine (ADDERALL) 5 MG tablet    Sig: Take 1 tablet (5 mg total) by mouth daily.    Dispense:  30 tablet    Refill:  0  . amphetamine-dextroamphetamine (ADDERALL) 5 MG tablet    Sig: Take 1 tablet (5 mg total) by mouth daily.    Dispense:  30 tablet    Refill:  0  . amphetamine-dextroamphetamine (ADDERALL) 5 MG tablet    Sig: Take 1 tablet (5 mg total) by mouth daily.    Dispense:  30 tablet    Refill:  0     I discussed  the assessment and treatment plan with the patient. The patient was provided an opportunity to ask questions and all were answered. The patient agreed with the plan and demonstrated an understanding of the instructions.   The patient was advised to call back or seek an in-person evaluation if the symptoms worsen or if the condition fails to improve as anticipated.    Tommi Rumps, MD

## 2020-06-26 ENCOUNTER — Other Ambulatory Visit
Admission: RE | Admit: 2020-06-26 | Discharge: 2020-06-26 | Disposition: A | Discharge status: Home / Self Care | Payer: 59 | Source: Ambulatory Visit | Attending: Gastroenterology | Admitting: Gastroenterology

## 2020-06-26 ENCOUNTER — Other Ambulatory Visit: Payer: Self-pay

## 2020-06-26 DIAGNOSIS — Z01812 Encounter for preprocedural laboratory examination: Secondary | ICD-10-CM | POA: Diagnosis not present

## 2020-06-26 DIAGNOSIS — Z20822 Contact with and (suspected) exposure to covid-19: Secondary | ICD-10-CM | POA: Diagnosis not present

## 2020-06-27 ENCOUNTER — Encounter: Payer: Self-pay | Admitting: Gastroenterology

## 2020-06-27 LAB — SARS CORONAVIRUS 2 (TAT 6-24 HRS): SARS Coronavirus 2: NEGATIVE

## 2020-06-30 ENCOUNTER — Other Ambulatory Visit: Payer: Self-pay

## 2020-06-30 ENCOUNTER — Ambulatory Visit: Payer: 59 | Admitting: Certified Registered"

## 2020-06-30 ENCOUNTER — Encounter
Admission: RE | Disposition: A | Discharge status: Home / Self Care | Payer: Self-pay | Source: Home / Self Care | Attending: Gastroenterology

## 2020-06-30 ENCOUNTER — Ambulatory Visit
Admission: RE | Admit: 2020-06-30 | Discharge: 2020-06-30 | Disposition: A | Discharge status: Home / Self Care | Payer: 59 | Attending: Gastroenterology | Admitting: Gastroenterology

## 2020-06-30 ENCOUNTER — Encounter: Payer: Self-pay | Admitting: Gastroenterology

## 2020-06-30 DIAGNOSIS — Z793 Long term (current) use of hormonal contraceptives: Secondary | ICD-10-CM | POA: Insufficient documentation

## 2020-06-30 DIAGNOSIS — F329 Major depressive disorder, single episode, unspecified: Secondary | ICD-10-CM | POA: Insufficient documentation

## 2020-06-30 DIAGNOSIS — Z87891 Personal history of nicotine dependence: Secondary | ICD-10-CM | POA: Diagnosis not present

## 2020-06-30 DIAGNOSIS — Z881 Allergy status to other antibiotic agents status: Secondary | ICD-10-CM | POA: Insufficient documentation

## 2020-06-30 DIAGNOSIS — Z79899 Other long term (current) drug therapy: Secondary | ICD-10-CM | POA: Insufficient documentation

## 2020-06-30 DIAGNOSIS — R195 Other fecal abnormalities: Secondary | ICD-10-CM | POA: Diagnosis not present

## 2020-06-30 DIAGNOSIS — K219 Gastro-esophageal reflux disease without esophagitis: Secondary | ICD-10-CM | POA: Diagnosis not present

## 2020-06-30 DIAGNOSIS — Z975 Presence of (intrauterine) contraceptive device: Secondary | ICD-10-CM | POA: Diagnosis not present

## 2020-06-30 DIAGNOSIS — K921 Melena: Secondary | ICD-10-CM

## 2020-06-30 DIAGNOSIS — I7300 Raynaud's syndrome without gangrene: Secondary | ICD-10-CM | POA: Insufficient documentation

## 2020-06-30 DIAGNOSIS — K5900 Constipation, unspecified: Secondary | ICD-10-CM

## 2020-06-30 DIAGNOSIS — R194 Change in bowel habit: Secondary | ICD-10-CM | POA: Insufficient documentation

## 2020-06-30 HISTORY — PX: COLONOSCOPY WITH PROPOFOL: SHX5780

## 2020-06-30 LAB — POCT PREGNANCY, URINE: Preg Test, Ur: NEGATIVE

## 2020-06-30 SURGERY — COLONOSCOPY WITH PROPOFOL
Anesthesia: General

## 2020-06-30 MED ORDER — PROPOFOL 500 MG/50ML IV EMUL
INTRAVENOUS | Status: DC | PRN
  Administered 2020-06-30: 150 ug/kg/min via INTRAVENOUS

## 2020-06-30 MED ORDER — PROPOFOL 500 MG/50ML IV EMUL
INTRAVENOUS | Status: AC
  Filled 2020-06-30: qty 50

## 2020-06-30 MED ORDER — SODIUM CHLORIDE 0.9 % IV SOLN
INTRAVENOUS | Status: DC
  Administered 2020-06-30: 20 mL/h via INTRAVENOUS

## 2020-06-30 MED ORDER — PROPOFOL 10 MG/ML IV BOLUS
INTRAVENOUS | Status: AC
  Filled 2020-06-30: qty 20

## 2020-06-30 MED ORDER — PROPOFOL 10 MG/ML IV BOLUS
INTRAVENOUS | Status: DC | PRN
  Administered 2020-06-30: 50 mg via INTRAVENOUS

## 2020-06-30 NOTE — Op Note (Signed)
[facility] Gastroenterology Patient Name: Jacqueline Craig Procedure Date: 06/30/2020 9:41 AM MRN: [id number] Account #: [id number] Date of Birth: 10/29/1985 Admit Type: Outpatient Age: 34 Room: [facility] ENDO ROOM 2 Gender: Female Note Status: Finalized Procedure:             Colonoscopy Indications:           This is the patient's first colonoscopy, Change in                         bowel habits Providers:             Lin Landsman MD, MD Referring MD:          Angela Adam. Caryl Bis (Referring MD) Medicines:             Monitored Anesthesia Care Complications:         No immediate complications. Estimated blood loss: None. Procedure:             Pre-Anesthesia Assessment:                        - Prior to the procedure, a History and Physical was                         performed, and patient medications and allergies were                         reviewed. The patient is competent. The risks and                         benefits of the procedure and the sedation options and                         risks were discussed with the patient. All questions                         were answered and informed consent was obtained.                         Patient identification and proposed procedure were                         verified by the physician, the nurse, the                         anesthesiologist, the anesthetist and the technician                         in the pre-procedure area in the procedure room in the                         endoscopy suite. Mental Status Examination: alert and                         oriented. Airway Examination: normal oropharyngeal                         airway and neck mobility. Respiratory Examination:  clear to auscultation. CV Examination: normal.                         Prophylactic Antibiotics: The patient does not require                         prophylactic antibiotics. Prior Anticoagulants: The                          patient has taken no previous anticoagulant or                         antiplatelet agents. ASA Grade Assessment: II - A                         patient with mild systemic disease. After reviewing                         the risks and benefits, the patient was deemed in                         satisfactory condition to undergo the procedure. The                         anesthesia plan was to use monitored anesthesia care                         (MAC). Immediately prior to administration of                         medications, the patient was re-assessed for adequacy                         to receive sedatives. The heart rate, respiratory                         rate, oxygen saturations, blood pressure, adequacy of                         pulmonary ventilation, and response to care were                         monitored throughout the procedure. The physical                         status of the patient was re-assessed after the                         procedure.                        After obtaining informed consent, the colonoscope was                         passed under direct vision. Throughout the procedure,                         the patient's blood pressure, pulse, and oxygen  saturations were monitored continuously. The                         Colonoscope was introduced through the anus and                         advanced to the the terminal ileum, with                         identification of the appendiceal orifice and IC                         valve. The colonoscopy was performed without                         difficulty. The patient tolerated the procedure well.                         The quality of the bowel preparation was evaluated                         using the [facility] Bowel Preparation Scale) with                         scores of: Right Colon = 3, Transverse Colon = 3 and                         Left Colon = 3 (entire  mucosa seen well with no                         residual staining, small fragments of stool or opaque                         liquid). The total BBPS score equals 9. Findings:      The perianal and digital rectal examinations were normal. Pertinent       negatives include normal sphincter tone and no palpable rectal lesions.      The terminal ileum appeared normal.      The colon (entire examined portion) appeared normal. Extrinsic       compression of the appendiceal orifice      The retroflexed view of the distal rectum and anal verge was normal and       showed no anal or rectal abnormalities. Impression:            - The examined portion of the ileum was normal.                        - The entire examined colon is normal.                        - The distal rectum and anal verge are normal on                         retroflexion view.                        - No specimens collected. Recommendation:        - Discharge patient to  home (with escort).                        - Resume previous diet today.                        - Continue present medications.                        - Repeat colonoscopy in 10 years for screening                         purposes.                        - Recommend CT Abdomen and pelvis with contrast to                         evaluate extrinsic compression in cecum Procedure Code(s):     --- Professional ---                        [phone], Colonoscopy, flexible; diagnostic, including                         collection of specimen(s) by brushing or washing, when                         performed (separate procedure) Diagnosis Code(s):     --- Professional ---                        R19.4, Change in bowel habit CPT copyright 2019 American Medical Association. All rights reserved. The codes documented in this report are preliminary and upon coder review may  be revised to meet current compliance requirements. Dr. Ulyess Mort Lin Landsman MD,  MD 06/30/2020 10:12:29 AM This report has been signed electronically. Number of Addenda: 0 Note Initiated On: 06/30/2020 9:41 AM Scope Withdrawal Time: 0 hours 7 minutes 1 second  Total Procedure Duration: 0 hours 12 minutes 46 seconds  Estimated Blood Loss:  Estimated blood loss: none.      [facility]

## 2020-06-30 NOTE — Transfer of Care (Signed)
Immediate Anesthesia Transfer of Care Note  Patient: Jacqueline Craig  Procedure(s) Performed: COLONOSCOPY WITH PROPOFOL (N/A )  Patient Location: PACU and Endoscopy Unit  Anesthesia Type:General  Level of Consciousness: drowsy  Airway & Oxygen Therapy: Patient Spontanous Breathing  Post-op Assessment: Report given to RN  Post vital signs: stable  Last Vitals:  Vitals Value Taken Time  BP    Temp    Pulse    Resp    SpO2      Last Pain:  Vitals:   06/30/20 0912  TempSrc: Skin  PainSc: 0-No pain         Complications: No complications documented.

## 2020-06-30 NOTE — H&P (Signed)
Jacqueline Darby, MD [address]  [place]  [place], [place] [address]  Main: [phone]  Fax: [phone] Pager: [phone]  Primary Care Physician:  Leone Haven, MD Primary Gastroenterologist:  Dr. Cephas Craig  Pre-Procedure History & Physical: HPI:  Jacqueline Craig is a 34 y.o. female is here for an colonoscopy.   Past Medical History:  Diagnosis Date  . Bilateral bunions   . Chickenpox   . GERD (gastroesophageal reflux disease)   . High blood pressure   . Raynaud phenomenon   . UTI (lower urinary tract infection)     No past surgical history on file.  Prior to Admission medications   Medication Sig Start Date End Date Taking? Authorizing Provider  amphetamine-dextroamphetamine (ADDERALL XR) 20 MG 24 hr capsule Take 2 capsules (40 mg total) by mouth daily. 06/23/20   Leone Haven, MD  amphetamine-dextroamphetamine (ADDERALL) 5 MG tablet Take 1 tablet (5 mg total) by mouth daily. 08/23/20   Leone Haven, MD  amphetamine-dextroamphetamine (ADDERALL) 5 MG tablet Take 1 tablet (5 mg total) by mouth daily. 07/23/20   Leone Haven, MD  amphetamine-dextroamphetamine (ADDERALL) 5 MG tablet Take 1 tablet (5 mg total) by mouth daily. 06/23/20   Leone Haven, MD  buPROPion (WELLBUTRIN XL) 300 MG 24 hr tablet TAKE 1 TABLET BY MOUTH DAILY. 02/25/20   Leone Haven, MD  Levonorgestrel (SKYLA) 13.5 MG IUD by Intrauterine route.    [provider]  pantoprazole (PROTONIX) 40 MG tablet Take 1 tablet (40 mg total) by mouth daily. 08/10/19   Leone Haven, MD  valACYclovir (VALTREX) 1000 MG tablet Take 2 tablets (2,000 mg total) by mouth 2 (two) times daily. For one day at first sign of lesions. 01/01/19   Leone Haven, MD    Allergies as of 06/12/2020 - Review Complete 03/31/2020  Allergen Reaction Noted  . Ciprofloxacin Palpitations 08/19/2015    Family History  Problem Relation Age of Onset  . Alcoholism Other         Parent  . Arthritis Other        Grandparent  . Prostate cancer Other        Father  . Hyperlipidemia Other        Grandparent  . Heart disease Other        Grandparent  . Stroke Other        Grandparent  . Hypertension Other        Grandparent  . Mental illness Other        Other relative  . Diabetes Other        Grandparent    Social History   Socioeconomic History  . Marital status: Married    Spouse name: Not on file  . Number of children: Not on file  . Years of education: Not on file  . Highest education level: Not on file  Occupational History  . Not on file  Tobacco Use  . Smoking status: Former Research scientist (life sciences)  . Smokeless tobacco: Never Used  Substance and Sexual Activity  . Alcohol use: Yes    Alcohol/week: 7.0 standard drinks    Types: 7 Standard drinks or equivalent per week  . Drug use: No  . Sexual activity: Not on file  Other Topics Concern  . Not on file  Social History Narrative  . Not on file   Social Determinants of Health   Financial Resource Strain:   . Difficulty of Paying Living Expenses: Not  on file  Food Insecurity:   . Worried About Charity fundraiser in the Last Year: Not on file  . Ran Out of Food in the Last Year: Not on file  Transportation Needs:   . Lack of Transportation (Medical): Not on file  . Lack of Transportation (Non-Medical): Not on file  Physical Activity:   . Days of Exercise per Week: Not on file  . Minutes of Exercise per Session: Not on file  Stress:   . Feeling of Stress : Not on file  Social Connections:   . Frequency of Communication with Friends and Family: Not on file  . Frequency of Social Gatherings with Friends and Family: Not on file  . Attends Religious Services: Not on file  . Active Member of Clubs or Organizations: Not on file  . Attends Archivist Meetings: Not on file  . Marital Status: Not on file  Intimate Partner Violence:   . Fear of Current or Ex-Partner: Not on file  . Emotionally  Abused: Not on file  . Physically Abused: Not on file  . Sexually Abused: Not on file    Review of Systems: See HPI, otherwise negative ROS  Physical Exam: BP 109/78   Pulse 79   Temp 97.7 F (36.5 C) (Skin)   Resp 20   Ht 5\' 5"  (1.651 m)   Wt 54.4 kg   SpO2 96%   BMI 19.97 kg/m  General:   Alert,  pleasant and cooperative in NAD Head:  Normocephalic and atraumatic. Neck:  Supple; no masses or thyromegaly. Lungs:  Clear throughout to auscultation.    Heart:  Regular rate and rhythm. Abdomen:  Soft, nontender and nondistended. Normal bowel sounds, without guarding, and without rebound.   Neurologic:  Alert and  oriented x4;  grossly normal neurologically.  Impression/Plan: [name] is here for an colonoscopy to be performed for dark stools  Risks, benefits, limitations, and alternatives regarding  colonoscopy have been reviewed with the patient.  Questions have been answered.  All parties agreeable.   Sherri Sear, MD  06/30/2020, 9:46 AM

## 2020-06-30 NOTE — Anesthesia Postprocedure Evaluation (Signed)
Anesthesia Post Note  Patient: Jacqueline Craig  Procedure(s) Performed: COLONOSCOPY WITH PROPOFOL (N/A )  Patient location during evaluation: Endoscopy Anesthesia Type: General Level of consciousness: awake and alert Pain management: pain level controlled Vital Signs Assessment: post-procedure vital signs reviewed and stable Respiratory status: spontaneous breathing, nonlabored ventilation, respiratory function stable and patient connected to nasal cannula oxygen Cardiovascular status: blood pressure returned to baseline and stable Postop Assessment: no apparent nausea or vomiting Anesthetic complications: no   No complications documented.   Last Vitals:  Vitals:   06/30/20 1031 06/30/20 1033  BP: 106/71 104/75  Pulse: 83   Resp: 17   Temp:    SpO2: 100%     Last Pain:  Vitals:   06/30/20 0912  TempSrc: Skin  PainSc: 0-No pain                 Arita Miss

## 2020-06-30 NOTE — Anesthesia Preprocedure Evaluation (Signed)
Anesthesia Evaluation  Patient identified by MRN, date of birth, ID band Patient awake    Reviewed: Allergy & Precautions, NPO status , Patient's Chart, lab work & pertinent test results  History of Anesthesia Complications Negative for: history of anesthetic complications  Airway Mallampati: II  TM Distance: >3 FB Neck ROM: Full    Dental no notable dental hx. (+) Teeth Intact   Pulmonary neg pulmonary ROS, neg sleep apnea, neg COPD, Patient abstained from smoking.Not current smoker, former smoker,    Pulmonary exam normal breath sounds clear to auscultation       Cardiovascular Exercise Tolerance: Good METS(-) hypertension(-) CAD and (-) Past MI (-) dysrhythmias  Rhythm:Regular Rate:Normal - Systolic murmurs 7169 TTE unremarkable (done for palpitations)   Neuro/Psych PSYCHIATRIC DISORDERS Anxiety Depression negative neurological ROS     GI/Hepatic GERD  Controlled,(+)     (-) substance abuse  ,   Endo/Other  neg diabetes  Renal/GU negative Renal ROS     Musculoskeletal   Abdominal   Peds  Hematology   Anesthesia Other Findings Past Medical History: No date: Bilateral bunions No date: Chickenpox No date: GERD (gastroesophageal reflux disease) No date: High blood pressure No date: Raynaud phenomenon No date: UTI (lower urinary tract infection)  Reproductive/Obstetrics                            Anesthesia Physical Anesthesia Plan  ASA: II  Anesthesia Plan: General   Post-op Pain Management:    Induction: Intravenous  PONV Risk Score and Plan: 3 and Ondansetron, Propofol infusion and TIVA  Airway Management Planned: Nasal Cannula  Additional Equipment: None  Intra-op Plan:   Post-operative Plan:   Informed Consent: I have reviewed the patients History and Physical, chart, labs and discussed the procedure including the risks, benefits and alternatives for the proposed  anesthesia with the patient or authorized representative who has indicated his/her understanding and acceptance.     Dental advisory given  Plan Discussed with: CRNA and Surgeon  Anesthesia Plan Comments: (Discussed risks of anesthesia with patient, including possibility of difficulty with spontaneous ventilation under anesthesia necessitating airway intervention, PONV, and rare risks such as cardiac or respiratory or neurological events. Patient understands.)        Anesthesia Quick Evaluation

## 2020-07-01 ENCOUNTER — Telehealth: Payer: Self-pay

## 2020-07-01 DIAGNOSIS — R1900 Intra-abdominal and pelvic swelling, mass and lump, unspecified site: Secondary | ICD-10-CM

## 2020-07-01 DIAGNOSIS — D179 Benign lipomatous neoplasm, unspecified: Secondary | ICD-10-CM

## 2020-07-01 NOTE — Telephone Encounter (Signed)
Per Dr. Marius Ditch colonoscopy report  patient needed CT abdominal and pelvis with contrast scan. Called patient and patient is off work on 07/11/2020 07/16/2020, 07/23/2020, 07/31/2020, and 08/07/2020. Called and got patient scheduled for 07/16/2020 at 9:00 at out patient imaging at 8:45am. Nothing to eat or drink after midnight. Pick up contrast before the scan. Called patient back and patient verbalized understanding

## 2020-07-16 ENCOUNTER — Ambulatory Visit: Admission: RE | Admit: 2020-07-16 | Payer: 59 | Source: Ambulatory Visit

## 2020-07-16 ENCOUNTER — Ambulatory Visit
Admission: RE | Admit: 2020-07-16 | Discharge: 2020-07-16 | Disposition: A | Discharge status: Home / Self Care | Payer: 59 | Source: Ambulatory Visit | Attending: Gastroenterology | Admitting: Gastroenterology

## 2020-07-16 ENCOUNTER — Other Ambulatory Visit: Payer: Self-pay

## 2020-07-16 DIAGNOSIS — D179 Benign lipomatous neoplasm, unspecified: Secondary | ICD-10-CM | POA: Insufficient documentation

## 2020-07-16 DIAGNOSIS — R1900 Intra-abdominal and pelvic swelling, mass and lump, unspecified site: Secondary | ICD-10-CM | POA: Diagnosis not present

## 2020-07-16 DIAGNOSIS — R109 Unspecified abdominal pain: Secondary | ICD-10-CM | POA: Diagnosis not present

## 2020-07-16 MED ORDER — IOHEXOL 300 MG/ML  SOLN
80.0000 mL | Freq: Once | INTRAMUSCULAR | Status: AC | PRN
  Administered 2020-07-16: 80 mL via INTRAVENOUS

## 2020-07-21 ENCOUNTER — Other Ambulatory Visit: Payer: Self-pay | Admitting: Family Medicine

## 2020-07-31 ENCOUNTER — Ambulatory Visit (INDEPENDENT_AMBULATORY_CARE_PROVIDER_SITE_OTHER): Payer: 59

## 2020-07-31 ENCOUNTER — Ambulatory Visit: Payer: 59 | Admitting: Podiatry

## 2020-07-31 ENCOUNTER — Other Ambulatory Visit: Payer: Self-pay

## 2020-07-31 DIAGNOSIS — Q666 Other congenital valgus deformities of feet: Secondary | ICD-10-CM

## 2020-07-31 DIAGNOSIS — M21619 Bunion of unspecified foot: Secondary | ICD-10-CM

## 2020-08-05 ENCOUNTER — Encounter: Payer: Self-pay | Admitting: Podiatry

## 2020-08-05 NOTE — Progress Notes (Signed)
Subjective:  Patient ID: Jacqueline Craig, female    DOB: [birth year],  MRN: [id number]  Chief Complaint  Patient presents with  . Bunions    PT HAS BILATERAL BUNIONS     34 y.o. female presents with the above complaint.  Patient presents with complaint of bilateral bunion deformity painful with high heels.  Patient is a Equities trader.  She states she hikes and twisted her foot all the time.  She states that she walks on the outside of the foot.  She has tried some toe spacers yoga toes for treatments but none of that has helped.  She only buys mesh shoes because it prevents her bunions from start to become painful.  She says she takes her toes when she is hiking.  She denies any other acute complaints.  She would like to discuss treatment options for it.  Her pain scale is 6 out of 10.  Only painful during hiking.  She has not tried any kind of custom orthotics.  She has not seen anyone else prior to seeing me.  Review of Systems: Negative except as noted in the HPI. Denies N/V/F/Ch.  Past Medical History:  Diagnosis Date  . Bilateral bunions   . Chickenpox   . GERD (gastroesophageal reflux disease)   . Raynaud phenomenon   . UTI (lower urinary tract infection)     Current Outpatient Medications:  .  ADDERALL XR 20 MG 24 hr capsule, TAKE 2 CAPSULES (40 MG) BY MOUTH DAILY., Disp: 60 capsule, Rfl: 0 .  [START ON 08/23/2020] amphetamine-dextroamphetamine (ADDERALL) 5 MG tablet, Take 1 tablet (5 mg total) by mouth daily., Disp: 30 tablet, Rfl: 0 .  amphetamine-dextroamphetamine (ADDERALL) 5 MG tablet, Take 1 tablet (5 mg total) by mouth daily., Disp: 30 tablet, Rfl: 0 .  amphetamine-dextroamphetamine (ADDERALL) 5 MG tablet, Take 1 tablet (5 mg total) by mouth daily., Disp: 30 tablet, Rfl: 0 .  buPROPion (WELLBUTRIN XL) 300 MG 24 hr tablet, TAKE 1 TABLET BY MOUTH DAILY., Disp: 90 tablet, Rfl: 1 .  Levonorgestrel (SKYLA) 13.5 MG IUD, by Intrauterine route., Disp: , Rfl:  .  pantoprazole  (PROTONIX) 40 MG tablet, Take 1 tablet (40 mg total) by mouth daily., Disp: 90 tablet, Rfl: 1 .  valACYclovir (VALTREX) 1000 MG tablet, Take 2 tablets (2,000 mg total) by mouth 2 (two) times daily. For one day at first sign of lesions., Disp: 10 tablet, Rfl: 1  Social History   Tobacco Use  Smoking Status Former Smoker  Smokeless Tobacco Never Used    Allergies  Allergen Reactions  . Ciprofloxacin Palpitations   Objective:  There were no vitals filed for this visit. There is no height or weight on file to calculate BMI. Constitutional Well developed. Well nourished.  Vascular Dorsalis pedis pulses palpable bilaterally. Posterior tibial pulses palpable bilaterally. Capillary refill normal to all digits.  No cyanosis or clubbing noted. Pedal hair growth normal.  Neurologic Normal speech. Oriented to person, place, and time. Epicritic sensation to light touch grossly present bilaterally.  Dermatologic Nails well groomed and normal in appearance. No open wounds. No skin lesions.  Orthopedic: Normal joint ROM without pain or crepitus bilaterally. Hallux abductovalgus deformity present bilaterally.  The deformity is tracking not track bound deformity it appears to be moderate in nature.  No intra-articular pain noted left 1st MPJ full range of motion. Left 1st TMT without gross hypermobility. Right 1st MPJ full range of motion  Right 1st TMT without gross hypermobility. Lesser digital contractures  absent bilaterally.   Radiographs: Taken and reviewed. *  No follow-ups on file. Hallux abductovalgus deformity present bilaterally. Metatarsal parabola normal. 1st/2nd IMA: Moderate; TSP: 6.  There is increasing hallux valgus angle.  There is increase in intermetatarsal angle.  Hypertrophy of the medial eminence noted.  Assessment:   1. Pes planovalgus   2. Bunion    Plan:  Patient was evaluated and treated and all questions answered.  Hallux abductovalgus deformity,  -I  discussed with the patient the etiology of bunion deformity and various treatment options were discussed.  I discussed with her that the underlying driving force is pes planovalgus leading to hypermobility in the forefoot and therefore the increasing bunion deformity.  I discussed with the patient that she will ultimately need surgical intervention to address the bunion deformity for which she states that she would like to do it and sometimes early future.  At this time she would like to discuss conservative treatment options.  Shoe gear modification was also discussed  Pes planovalgus semiflexible -Patient the etiology of pes planovalgus and various treatment options were discussed especially his relationship of bunion deformity.  I believe patient will benefit from custom-made orthotics to help control the hindfoot motion as well as support the arch of the foot. -She will be scheduled to see rec for custom-made orthotics.

## 2020-08-07 DIAGNOSIS — N926 Irregular menstruation, unspecified: Secondary | ICD-10-CM | POA: Diagnosis not present

## 2020-08-07 DIAGNOSIS — R109 Unspecified abdominal pain: Secondary | ICD-10-CM | POA: Diagnosis not present

## 2020-08-07 DIAGNOSIS — R87619 Unspecified abnormal cytological findings in specimens from cervix uteri: Secondary | ICD-10-CM | POA: Insufficient documentation

## 2020-08-07 DIAGNOSIS — Z01419 Encounter for gynecological examination (general) (routine) without abnormal findings: Secondary | ICD-10-CM | POA: Diagnosis not present

## 2020-08-07 DIAGNOSIS — K2970 Gastritis, unspecified, without bleeding: Secondary | ICD-10-CM | POA: Insufficient documentation

## 2020-08-15 ENCOUNTER — Encounter: Payer: Self-pay | Admitting: Family Medicine

## 2020-08-15 ENCOUNTER — Other Ambulatory Visit: Payer: Self-pay | Admitting: Family Medicine

## 2020-08-15 ENCOUNTER — Ambulatory Visit: Payer: 59 | Admitting: Family Medicine

## 2020-08-15 ENCOUNTER — Other Ambulatory Visit: Payer: Self-pay

## 2020-08-15 VITALS — BP 118/78 | HR 93 | Temp 97.8°F | Ht 65.5 in | Wt 123.6 lb

## 2020-08-15 DIAGNOSIS — F902 Attention-deficit hyperactivity disorder, combined type: Secondary | ICD-10-CM | POA: Diagnosis not present

## 2020-08-15 DIAGNOSIS — R1011 Right upper quadrant pain: Secondary | ICD-10-CM

## 2020-08-15 DIAGNOSIS — Z13220 Encounter for screening for lipoid disorders: Secondary | ICD-10-CM

## 2020-08-15 LAB — COMPREHENSIVE METABOLIC PANEL
ALT: 32 U/L (ref 0–35)
AST: 76 U/L — ABNORMAL HIGH (ref 0–37)
Albumin: 4.9 g/dL (ref 3.5–5.2)
Alkaline Phosphatase: 49 U/L (ref 39–117)
BUN: 14 mg/dL (ref 6–23)
CO2: 32 mEq/L (ref 19–32)
Calcium: 10 mg/dL (ref 8.4–10.5)
Chloride: 103 mEq/L (ref 96–112)
Creatinine, Ser: 0.88 mg/dL (ref 0.40–1.20)
GFR: 84.62 mL/min (ref >60.00)
Glucose, Bld: 104 mg/dL — ABNORMAL HIGH (ref 70–99)
Potassium: 4 mEq/L (ref 3.5–5.1)
Sodium: 141 mEq/L (ref 135–145)
Total Bilirubin: 0.7 mg/dL (ref 0.2–1.2)
Total Protein: 7.1 g/dL (ref 6.0–8.3)

## 2020-08-15 LAB — LDL CHOLESTEROL, DIRECT: Direct LDL: 104 mg/dL

## 2020-08-15 MED ORDER — AMPHETAMINE-DEXTROAMPHET ER 20 MG PO CP24: ORAL_CAPSULE | ORAL | 0 refills | Status: DC

## 2020-08-15 MED ORDER — AMPHETAMINE-DEXTROAMPHET ER 20 MG PO CP24: 40.0000 mg | ORAL_CAPSULE | Freq: Every day | ORAL | 0 refills | Status: DC

## 2020-08-15 MED ORDER — AMPHETAMINE-DEXTROAMPHETAMINE 5 MG PO TABS: 5.0000 mg | ORAL_TABLET | Freq: Every day | ORAL | 0 refills | Status: DC

## 2020-08-15 NOTE — Assessment & Plan Note (Signed)
Chronic issue.  Potentially some of her discomfort could be related to ovarian cyst.  She will continue to monitor.  Has completed GI work-up.

## 2020-08-15 NOTE — Assessment & Plan Note (Signed)
Much improved on current dosing of Adderall.  Refills given.  Controlled substance database reviewed.

## 2020-08-15 NOTE — Progress Notes (Signed)
  Tommi Rumps, MD Phone: [phone]  Jacqueline Craig is a 34 y.o. female who presents today for f/u.  ADHD Medication: adderall XR 20 mg daily, adderall IR 5 mg afternoons Effectiveness: much more effective at higher dose of adderall XR Palpitations: no Sleep difficulty: no Appetite suppression: no  Chronic abdominal pain: Patient notes she ended up seeing GYN and they did a transvaginal ultrasound revealing bilateral ovarian cysts and fibroids.  She notes they advised it would be difficult to tell exactly how big they got during her cycle and that that could be contributing to some of her abdominal discomfort.  She did see GI as well and had a normal colonoscopy as well as a negative CT scan.   Social History   Tobacco Use  Smoking Status Former Smoker  Smokeless Tobacco Never Used     ROS see history of present illness  Objective  Physical Exam Vitals:   08/15/20 1043  BP: 118/78  Pulse: 93  Temp: 97.8 F (36.6 C)  SpO2: 99%    BP Readings from Last 3 Encounters:  08/15/20 118/78  06/30/20 104/75  01/01/19 100/76   Wt Readings from Last 3 Encounters:  08/15/20 123 lb 9.6 oz (56.1 kg)  06/30/20 120 lb (54.4 kg)  03/31/20 120 lb (54.4 kg)    Physical Exam Constitutional:      General: She is not in acute distress.    Appearance: She is not diaphoretic.  Cardiovascular:     Rate and Rhythm: Normal rate and regular rhythm.     Heart sounds: Normal heart sounds.  Pulmonary:     Effort: Pulmonary effort is normal.     Breath sounds: Normal breath sounds.  Skin:    General: Skin is warm and dry.  Neurological:     Mental Status: She is alert.      Assessment/Plan: Please see individual problem list.  Problem List Items Addressed This Visit    Attention deficit hyperactivity disorder (ADHD)    Much improved on current dosing of Adderall.  Refills given.  Controlled substance database reviewed.      Relevant Medications    amphetamine-dextroamphetamine (ADDERALL XR) 20 MG 24 hr capsule (Start on 10/20/2020)   amphetamine-dextroamphetamine (ADDERALL XR) 20 MG 24 hr capsule (Start on 09/19/2020)   amphetamine-dextroamphetamine (ADDERALL) 5 MG tablet (Start on 10/20/2020)   amphetamine-dextroamphetamine (ADDERALL) 5 MG tablet (Start on 09/19/2020)   amphetamine-dextroamphetamine (ADDERALL XR) 20 MG 24 hr capsule (Start on 08/20/2020)   Right upper quadrant pain    Chronic issue.  Potentially some of her discomfort could be related to ovarian cyst.  She will continue to monitor.  Has completed GI work-up.       Other Visit Diagnoses    Lipid screening    -  Primary   Relevant Orders   Comp Met (CMET)   Direct LDL      This visit occurred during the SARS-CoV-2 public health emergency.  Safety protocols were in place, including screening questions prior to the visit, additional usage of staff PPE, and extensive cleaning of exam room while observing appropriate contact time as indicated for disinfecting solutions.    Tommi Rumps, MD [place]

## 2020-08-15 NOTE — Patient Instructions (Signed)
Nice to see you. I have refilled your Adderall. We will contact you with your labs.

## 2020-08-18 ENCOUNTER — Encounter: Payer: Self-pay | Admitting: Family Medicine

## 2020-08-18 DIAGNOSIS — R945 Abnormal results of liver function studies: Secondary | ICD-10-CM

## 2020-08-18 DIAGNOSIS — R1011 Right upper quadrant pain: Secondary | ICD-10-CM

## 2020-08-18 DIAGNOSIS — R7989 Other specified abnormal findings of blood chemistry: Secondary | ICD-10-CM

## 2020-08-27 ENCOUNTER — Ambulatory Visit (INDEPENDENT_AMBULATORY_CARE_PROVIDER_SITE_OTHER): Payer: 59 | Admitting: Orthotics

## 2020-08-27 ENCOUNTER — Other Ambulatory Visit: Payer: Self-pay

## 2020-08-27 DIAGNOSIS — M21611 Bunion of right foot: Secondary | ICD-10-CM

## 2020-08-27 DIAGNOSIS — Q666 Other congenital valgus deformities of feet: Secondary | ICD-10-CM

## 2020-08-27 DIAGNOSIS — M21619 Bunion of unspecified foot: Secondary | ICD-10-CM

## 2020-08-27 DIAGNOSIS — M21612 Bunion of left foot: Secondary | ICD-10-CM

## 2020-08-27 NOTE — Progress Notes (Signed)
Cast today for CFO to address 1st met head/sesamoid offload (bunions); pes planovalgus, and frquent ankle rolling while hiking.  Plan on super deep cup (35m); with medial skivie and b/l medial lateral flanges.  k-wedge under 1st met

## 2020-08-28 NOTE — Addendum Note (Signed)
Addended by: Leone Haven on: 08/28/2020 10:34 AM   Modules accepted: Orders

## 2020-09-03 ENCOUNTER — Other Ambulatory Visit: Payer: 59 | Admitting: Orthotics

## 2020-09-08 ENCOUNTER — Other Ambulatory Visit
Admission: RE | Admit: 2020-09-08 | Discharge: 2020-09-08 | Disposition: A | Discharge status: Home / Self Care | Payer: 59 | Attending: Family Medicine | Admitting: Family Medicine

## 2020-09-08 ENCOUNTER — Telehealth: Payer: Self-pay | Admitting: Family Medicine

## 2020-09-08 ENCOUNTER — Encounter: Payer: Self-pay | Admitting: Family Medicine

## 2020-09-08 DIAGNOSIS — R945 Abnormal results of liver function studies: Secondary | ICD-10-CM | POA: Diagnosis not present

## 2020-09-08 DIAGNOSIS — R7989 Other specified abnormal findings of blood chemistry: Secondary | ICD-10-CM

## 2020-09-08 LAB — HEPATIC FUNCTION PANEL
ALT: 20 U/L (ref 0–44)
AST: 21 U/L (ref 15–41)
Albumin: 4.9 g/dL (ref 3.5–5.0)
Alkaline Phosphatase: 46 U/L (ref 38–126)
Bilirubin, Direct: <0.1 mg/dL (ref 0.0–0.2)
Total Bilirubin: 0.7 mg/dL (ref 0.3–1.2)
Total Protein: 7.6 g/dL (ref 6.5–8.1)

## 2020-09-08 NOTE — Telephone Encounter (Signed)
lft vm for pt to call to sch or call ofc if for any questions or concerns.

## 2020-09-12 ENCOUNTER — Ambulatory Visit
Admission: RE | Admit: 2020-09-12 | Discharge: 2020-09-12 | Disposition: A | Discharge status: Home / Self Care | Payer: 59 | Source: Ambulatory Visit | Attending: Family Medicine | Admitting: Family Medicine

## 2020-09-12 ENCOUNTER — Other Ambulatory Visit: Payer: Self-pay

## 2020-09-12 DIAGNOSIS — R1011 Right upper quadrant pain: Secondary | ICD-10-CM | POA: Insufficient documentation

## 2020-09-12 DIAGNOSIS — R7989 Other specified abnormal findings of blood chemistry: Secondary | ICD-10-CM

## 2020-09-12 DIAGNOSIS — R945 Abnormal results of liver function studies: Secondary | ICD-10-CM | POA: Insufficient documentation

## 2020-09-16 ENCOUNTER — Other Ambulatory Visit: Payer: Self-pay | Admitting: Family Medicine

## 2020-09-24 ENCOUNTER — Ambulatory Visit: Payer: 59 | Admitting: Orthotics

## 2020-10-07 ENCOUNTER — Ambulatory Visit: Payer: 59 | Admitting: Orthotics

## 2020-10-07 ENCOUNTER — Other Ambulatory Visit: Payer: Self-pay

## 2020-10-07 DIAGNOSIS — M21619 Bunion of unspecified foot: Secondary | ICD-10-CM

## 2020-10-07 DIAGNOSIS — Q666 Other congenital valgus deformities of feet: Secondary | ICD-10-CM

## 2020-10-07 NOTE — Progress Notes (Signed)
Patient came in today to pick up custom made foot orthotics.  The goals were accomplished and the patient reported no dissatisfaction with said orthotics.  Patient was advised of breakin period and how to report any issues. 

## 2020-10-18 ENCOUNTER — Encounter: Payer: Self-pay | Admitting: Podiatry

## 2020-11-13 ENCOUNTER — Other Ambulatory Visit: Payer: Self-pay | Admitting: Family Medicine

## 2020-11-13 DIAGNOSIS — R1011 Right upper quadrant pain: Secondary | ICD-10-CM

## 2020-11-19 ENCOUNTER — Other Ambulatory Visit: Payer: Self-pay | Admitting: Family Medicine

## 2020-11-19 DIAGNOSIS — F902 Attention-deficit hyperactivity disorder, combined type: Secondary | ICD-10-CM

## 2020-11-20 ENCOUNTER — Other Ambulatory Visit: Payer: Self-pay | Admitting: Family Medicine

## 2020-11-25 ENCOUNTER — Ambulatory Visit: Payer: 59 | Admitting: Family Medicine

## 2020-11-25 DIAGNOSIS — Z0289 Encounter for other administrative examinations: Secondary | ICD-10-CM

## 2020-12-17 ENCOUNTER — Other Ambulatory Visit: Payer: Self-pay | Admitting: Family Medicine

## 2020-12-17 ENCOUNTER — Ambulatory Visit: Payer: 59 | Admitting: Family Medicine

## 2020-12-17 ENCOUNTER — Other Ambulatory Visit: Payer: Self-pay

## 2020-12-17 ENCOUNTER — Encounter: Payer: Self-pay | Admitting: Family Medicine

## 2020-12-17 DIAGNOSIS — F419 Anxiety disorder, unspecified: Secondary | ICD-10-CM | POA: Diagnosis not present

## 2020-12-17 DIAGNOSIS — F32A Depression, unspecified: Secondary | ICD-10-CM

## 2020-12-17 DIAGNOSIS — F902 Attention-deficit hyperactivity disorder, combined type: Secondary | ICD-10-CM | POA: Diagnosis not present

## 2020-12-17 MED ORDER — AMPHETAMINE-DEXTROAMPHET ER 20 MG PO CP24: 40.0000 mg | ORAL_CAPSULE | Freq: Every day | ORAL | 0 refills | Status: DC

## 2020-12-17 MED ORDER — AMPHETAMINE-DEXTROAMPHET ER 20 MG PO CP24: ORAL_CAPSULE | ORAL | 0 refills | Status: DC

## 2020-12-17 MED ORDER — AMPHETAMINE-DEXTROAMPHETAMINE 5 MG PO TABS: 5.0000 mg | ORAL_TABLET | Freq: Every day | ORAL | 0 refills | Status: DC

## 2020-12-17 NOTE — Progress Notes (Signed)
Tommi Rumps, MD Phone: [phone]  Jacqueline Craig is a 35 y.o. female who presents today for f/u.  ADHD Medication: adderall XR 20 mg daily, adderall IR 5 mg later in the day depending on her work schedule Effectiveness: yes Palpitations: no Sleep difficulty: no Appetite suppression: no The patient does note she started exercising again recently.  She does circuit training with no breaks.  This includes rowing as well as doing squat jumps and other exercises.  She notes her max heart rate has gotten up to 180 while exercising.  She notes normal heart rate at rest.  Anxiety/depression: Patient notes this is situational based on work.  She feels as though the Wellbutrin is working well.  No SI.   Social History   Tobacco Use  Smoking Status Former Smoker  Smokeless Tobacco Never Used    Current Outpatient Medications on File Prior to Visit  Medication Sig Dispense Refill  . buPROPion (WELLBUTRIN XL) 300 MG 24 hr tablet TAKE 1 TABLET BY MOUTH DAILY 90 tablet 1  . Levonorgestrel 13.5 MG IUD by Intrauterine route.    . pantoprazole (PROTONIX) 40 MG tablet TAKE 1 TABLET (40 MG TOTAL) BY MOUTH DAILY. 90 tablet 1  . valACYclovir (VALTREX) 1000 MG tablet Take 2 tablets (2,000 mg total) by mouth 2 (two) times daily. For one day at first sign of lesions. 10 tablet 1   No current facility-administered medications on file prior to visit.     ROS see history of present illness  Objective  Physical Exam Vitals:   12/17/20 1129  BP: 105/70  Pulse: 86  Temp: 98.5 F (36.9 C)  SpO2: 99%    BP Readings from Last 3 Encounters:  12/17/20 105/70  08/15/20 118/78  06/30/20 104/75   Wt Readings from Last 3 Encounters:  12/17/20 129 lb 6.4 oz (58.7 kg)  08/15/20 123 lb 9.6 oz (56.1 kg)  06/30/20 120 lb (54.4 kg)    Physical Exam Constitutional:      General: She is not in acute distress.    Appearance: She is not diaphoretic.  Cardiovascular:     Rate and Rhythm:  Normal rate and regular rhythm.     Heart sounds: Normal heart sounds.  Pulmonary:     Effort: Pulmonary effort is normal.     Breath sounds: Normal breath sounds.  Musculoskeletal:        General: No edema.  Skin:    General: Skin is warm and dry.  Neurological:     Mental Status: She is alert.      Assessment/Plan: Please see individual problem list.  Problem List Items Addressed This Visit    Anxiety and depression    Generally stable.  She will continue Wellbutrin 300 mg once daily.  Discussed trying to use dictation software while working this that may help her get her notes done in a more timely manner and decrease her stress level.      Attention deficit hyperactivity disorder (ADHD)    Generally well controlled.  She will continue Adderall XR 20 mg once daily and and as needed dose of Adderall 5 mg in the afternoon depending on if she needs this.  She is considering pregnancy and knows she may need to discontinue this.  She will discuss further with her GYN.  I suspect her elevated heart rate during exercise is related to deconditioning as she just started back with exercise.  She will monitor and if this does not improve with time she will  let us know.      Relevant Medications   amphetamine-dextroamphetamine (ADDERALL XR) 20 MG 24 hr capsule (Start on 02/16/2021)   amphetamine-dextroamphetamine (ADDERALL XR) 20 MG 24 hr capsule (Start on 01/16/2021)   amphetamine-dextroamphetamine (ADDERALL XR) 20 MG 24 hr capsule   amphetamine-dextroamphetamine (ADDERALL) 5 MG tablet (Start on 02/16/2021)   amphetamine-dextroamphetamine (ADDERALL) 5 MG tablet (Start on 01/16/2021)   amphetamine-dextroamphetamine (ADDERALL) 5 MG tablet       This visit occurred during the SARS-CoV-2 public health emergency.  Safety protocols were in place, including screening questions prior to the visit, additional usage of staff PPE, and extensive cleaning of exam room while observing appropriate contact time  as indicated for disinfecting solutions.    Tommi Rumps, MD [place]

## 2020-12-17 NOTE — Assessment & Plan Note (Signed)
Generally well controlled.  She will continue Adderall XR 20 mg once daily and and as needed dose of Adderall 5 mg in the afternoon depending on if she needs this.  She is considering pregnancy and knows she may need to discontinue this.  She will discuss further with her GYN.  I suspect her elevated heart rate during exercise is related to deconditioning as she just started back with exercise.  She will monitor and if this does not improve with time she will let us know.

## 2020-12-17 NOTE — Assessment & Plan Note (Signed)
Generally stable.  She will continue Wellbutrin 300 mg once daily.  Discussed trying to use dictation software while working this that may help her get her notes done in a more timely manner and decrease her stress level.

## 2020-12-17 NOTE — Patient Instructions (Signed)
Nice to see you. We will refill your Adderall. Please monitor your heart rate and if it does not start to trend down with further exercise please let us know.

## 2021-01-14 ENCOUNTER — Ambulatory Visit: Payer: 59 | Admitting: Family Medicine

## 2021-01-15 ENCOUNTER — Other Ambulatory Visit: Payer: Self-pay

## 2021-01-15 MED FILL — Amphetamine-Dextroamphetamine Tab 5 MG: ORAL | 30 days supply | Qty: 30 | Fill #0 | Status: AC

## 2021-01-15 MED FILL — Amphetamine-Dextroamphetamine Cap ER 24HR 20 MG: ORAL | 30 days supply | Qty: 60 | Fill #0 | Status: CN

## 2021-01-15 MED FILL — Amphetamine-Dextroamphetamine Cap ER 24HR 20 MG: ORAL | 30 days supply | Qty: 60 | Fill #0 | Status: AC

## 2021-01-16 ENCOUNTER — Other Ambulatory Visit: Payer: Self-pay

## 2021-01-26 ENCOUNTER — Other Ambulatory Visit: Payer: Self-pay

## 2021-02-08 ENCOUNTER — Encounter: Payer: Self-pay | Admitting: Family Medicine

## 2021-02-09 ENCOUNTER — Other Ambulatory Visit: Payer: Self-pay

## 2021-02-09 MED FILL — Amphetamine-Dextroamphetamine Tab 5 MG: ORAL | 30 days supply | Qty: 30 | Fill #0 | Status: AC

## 2021-02-09 MED FILL — Amphetamine-Dextroamphetamine Cap ER 24HR 20 MG: ORAL | 30 days supply | Qty: 60 | Fill #0 | Status: AC

## 2021-02-10 ENCOUNTER — Other Ambulatory Visit: Payer: Self-pay

## 2021-02-11 ENCOUNTER — Other Ambulatory Visit: Payer: Self-pay

## 2021-02-12 ENCOUNTER — Other Ambulatory Visit: Payer: Self-pay

## 2021-02-13 ENCOUNTER — Other Ambulatory Visit: Payer: Self-pay

## 2021-02-16 ENCOUNTER — Other Ambulatory Visit: Payer: Self-pay

## 2021-02-17 ENCOUNTER — Other Ambulatory Visit: Payer: Self-pay

## 2021-02-18 DIAGNOSIS — Z818 Family history of other mental and behavioral disorders: Secondary | ICD-10-CM | POA: Diagnosis not present

## 2021-02-18 DIAGNOSIS — Z30432 Encounter for removal of intrauterine contraceptive device: Secondary | ICD-10-CM | POA: Diagnosis not present

## 2021-02-18 DIAGNOSIS — Z309 Encounter for contraceptive management, unspecified: Secondary | ICD-10-CM | POA: Diagnosis not present

## 2021-02-18 DIAGNOSIS — F32A Depression, unspecified: Secondary | ICD-10-CM | POA: Diagnosis not present

## 2021-02-18 DIAGNOSIS — Z8632 Personal history of gestational diabetes: Secondary | ICD-10-CM | POA: Diagnosis not present

## 2021-02-25 ENCOUNTER — Other Ambulatory Visit (HOSPITAL_BASED_OUTPATIENT_CLINIC_OR_DEPARTMENT_OTHER): Payer: Self-pay

## 2021-03-06 ENCOUNTER — Other Ambulatory Visit: Payer: Self-pay

## 2021-03-06 ENCOUNTER — Encounter: Payer: Self-pay | Admitting: Family Medicine

## 2021-03-06 MED ORDER — VALACYCLOVIR HCL 1 G PO TABS
2000.0000 mg | ORAL_TABLET | Freq: Two times a day (BID) | ORAL | 1 refills | Status: DC
  Filled 2021-03-06: qty 10, 2d supply, fill #0

## 2021-03-16 ENCOUNTER — Other Ambulatory Visit: Payer: Self-pay | Admitting: Family Medicine

## 2021-03-16 ENCOUNTER — Other Ambulatory Visit: Payer: Self-pay

## 2021-03-16 NOTE — Telephone Encounter (Signed)
duplicate

## 2021-03-16 NOTE — Telephone Encounter (Signed)
RX Refill:adderall Last Seen:12-17-20 Last ordered:02-16-21

## 2021-03-17 ENCOUNTER — Other Ambulatory Visit: Payer: Self-pay

## 2021-03-17 ENCOUNTER — Encounter: Payer: Self-pay | Admitting: Family Medicine

## 2021-03-17 DIAGNOSIS — F902 Attention-deficit hyperactivity disorder, combined type: Secondary | ICD-10-CM

## 2021-03-17 MED ORDER — AMPHETAMINE-DEXTROAMPHETAMINE 5 MG PO TABS
5.0000 mg | ORAL_TABLET | Freq: Every day | ORAL | 0 refills | Status: DC
  Filled 2021-03-17: qty 30, 30d supply, fill #0

## 2021-03-17 MED ORDER — AMPHETAMINE-DEXTROAMPHET ER 20 MG PO CP24
ORAL_CAPSULE | ORAL | 0 refills | Status: DC
  Filled 2021-03-17: qty 60, 30d supply, fill #0

## 2021-03-18 ENCOUNTER — Other Ambulatory Visit: Payer: Self-pay

## 2021-03-19 DIAGNOSIS — M21621 Bunionette of right foot: Secondary | ICD-10-CM | POA: Diagnosis not present

## 2021-03-19 DIAGNOSIS — M2012 Hallux valgus (acquired), left foot: Secondary | ICD-10-CM | POA: Diagnosis not present

## 2021-03-19 DIAGNOSIS — M21622 Bunionette of left foot: Secondary | ICD-10-CM | POA: Diagnosis not present

## 2021-03-19 DIAGNOSIS — M2011 Hallux valgus (acquired), right foot: Secondary | ICD-10-CM | POA: Diagnosis not present

## 2021-03-20 ENCOUNTER — Other Ambulatory Visit: Payer: Self-pay

## 2021-03-20 ENCOUNTER — Telehealth (INDEPENDENT_AMBULATORY_CARE_PROVIDER_SITE_OTHER): Payer: 59 | Admitting: Family Medicine

## 2021-03-20 DIAGNOSIS — Z3169 Encounter for other general counseling and advice on procreation: Secondary | ICD-10-CM | POA: Diagnosis not present

## 2021-03-20 DIAGNOSIS — F902 Attention-deficit hyperactivity disorder, combined type: Secondary | ICD-10-CM

## 2021-03-20 DIAGNOSIS — M2012 Hallux valgus (acquired), left foot: Secondary | ICD-10-CM | POA: Diagnosis not present

## 2021-03-20 DIAGNOSIS — M2010 Hallux valgus (acquired), unspecified foot: Secondary | ICD-10-CM | POA: Insufficient documentation

## 2021-03-20 DIAGNOSIS — M2011 Hallux valgus (acquired), right foot: Secondary | ICD-10-CM

## 2021-03-20 MED ORDER — AMPHETAMINE-DEXTROAMPHETAMINE 5 MG PO TABS
ORAL_TABLET | ORAL | 0 refills | Status: DC
  Filled 2021-04-22: qty 30, 30d supply, fill #0

## 2021-03-20 MED ORDER — AMPHETAMINE-DEXTROAMPHETAMINE 20 MG PO TABS: 20.0000 mg | ORAL_TABLET | Freq: Two times a day (BID) | ORAL | 0 refills | Status: DC

## 2021-03-20 MED ORDER — BUPROPION HCL ER (XL) 300 MG PO TB24
ORAL_TABLET | Freq: Every day | ORAL | 3 refills | Status: DC
  Filled 2021-03-20: qty 90, 90d supply, fill #0
  Filled 2021-07-06: qty 30, 30d supply, fill #1
  Filled 2021-08-04: qty 30, 30d supply, fill #2
  Filled 2021-09-02: qty 30, 30d supply, fill #3

## 2021-03-20 MED ORDER — AMPHETAMINE-DEXTROAMPHETAMINE 5 MG PO TABS: 5.0000 mg | ORAL_TABLET | Freq: Every day | ORAL | 0 refills | Status: DC

## 2021-03-20 MED ORDER — AMPHETAMINE-DEXTROAMPHETAMINE 20 MG PO TABS
20.0000 mg | ORAL_TABLET | Freq: Two times a day (BID) | ORAL | 0 refills | Status: DC
  Filled 2021-04-22: qty 60, 30d supply, fill #0

## 2021-03-20 MED ORDER — AMPHETAMINE-DEXTROAMPHETAMINE 20 MG PO TABS
20.0000 mg | ORAL_TABLET | Freq: Two times a day (BID) | ORAL | 0 refills | Status: DC
  Filled 2021-03-20 – 2021-03-23 (×2): qty 60, 30d supply, fill #0

## 2021-03-20 MED ORDER — AMPHETAMINE-DEXTROAMPHETAMINE 5 MG PO TABS
5.0000 mg | ORAL_TABLET | Freq: Every day | ORAL | 0 refills | Status: DC
  Filled 2021-03-20: qty 30, 30d supply, fill #0

## 2021-03-20 NOTE — Assessment & Plan Note (Signed)
She will proceed with podiatry treatment and evaluation.

## 2021-03-20 NOTE — Progress Notes (Signed)
Virtual Visit via video Note  This visit type was conducted due to national recommendations for restrictions regarding the COVID-19 pandemic (e.g. social distancing).  This format is felt to be most appropriate for this patient at this time.  All issues noted in this document were discussed and addressed.  No physical exam was performed (except for noted visual exam findings with Video Visits).   I connected with Jacqueline Craig today at  1:15 PM EDT by a video enabled telemedicine application and verified that I am speaking with the correct person using two identifiers. Location patient: home Location provider: work  Persons participating in the virtual visit: patient, provider  I discussed the limitations, risks, security and privacy concerns of performing an evaluation and management service by telephone and the availability of in person appointments. I also discussed with the patient that there may be a patient responsible charge related to this service. The patient expressed understanding and agreed to proceed.   Reason for visit: f/u.  HPI: ADHD Medication: adderall XR 40 mg daily and adderall 5 mg prn Effectiveness: yes, though she is having trouble with the duration of the medication particularly if she sleeps in Palpitations: no Sleep difficulty: no Appetite suppression: no  Preconception planning: Patient notes she had her IUD removed.  She met with GYN to discuss her Adderall and the possibility of pregnancy.  She notes the GYN advised that she could continue the Adderall up until the time she becomes pregnant.  Hallux valgus: She saw podiatry and it sounds as though they are planning a minimally invasive procedure for this.   ROS: See pertinent positives and negatives per HPI.  Past Medical History:  Diagnosis Date   Bilateral bunions    Chickenpox    GERD (gastroesophageal reflux disease)    Raynaud phenomenon    UTI (lower urinary tract infection)     Past  Surgical History:  Procedure Laterality Date   COLONOSCOPY WITH PROPOFOL N/A 06/30/2020   Procedure: COLONOSCOPY WITH PROPOFOL;  Surgeon: Lin Landsman, MD;  Location: [facility] ENDOSCOPY;  Service: Gastroenterology;  Laterality: N/A;    Family History  Problem Relation Age of Onset   Alcoholism Other        Parent   Arthritis Other        Grandparent   Prostate cancer Other        Father   Hyperlipidemia Other        Grandparent   Heart disease Other        Grandparent   Stroke Other        Grandparent   Hypertension Other        Grandparent   Mental illness Other        Other relative   Diabetes Other        Grandparent    SOCIAL HX: Former smoker   Current Outpatient Medications:    [START ON 05/20/2021] amphetamine-dextroamphetamine (ADDERALL) 20 MG tablet, Take 1 tablet (20 mg total) by mouth 2 (two) times daily., Disp: 60 tablet, Rfl: 0   [START ON 04/17/2021] amphetamine-dextroamphetamine (ADDERALL) 20 MG tablet, Take 1 tablet (20 mg total) by mouth 2 (two) times daily., Disp: 60 tablet, Rfl: 0   amphetamine-dextroamphetamine (ADDERALL) 20 MG tablet, Take 1 tablet (20 mg total) by mouth 2 (two) times daily., Disp: 60 tablet, Rfl: 0   pantoprazole (PROTONIX) 40 MG tablet, TAKE 1 TABLET BY MOUTH DAILY., Disp: 90 tablet, Rfl: 1   valACYclovir (VALTREX) 1000 MG tablet, Take  2 tablets (2,000 mg total) by mouth 2 (two) times daily. For one day at first sign of lesions., Disp: 10 tablet, Rfl: 1   [START ON 05/20/2021] amphetamine-dextroamphetamine (ADDERALL) 5 MG tablet, Take 1 tablet (5 mg total) by mouth daily., Disp: 30 tablet, Rfl: 0   [START ON 04/17/2021] amphetamine-dextroamphetamine (ADDERALL) 5 MG tablet, TAKE 1 TABLET BY MOUTH DAILY. FILL 02-16-21, Disp: 30 tablet, Rfl: 0   amphetamine-dextroamphetamine (ADDERALL) 5 MG tablet, Take 1 tablet (5 mg total) by mouth daily., Disp: 30 tablet, Rfl: 0   buPROPion (WELLBUTRIN XL) 300 MG 24 hr tablet, TAKE 1 TABLET BY MOUTH DAILY,  Disp: 90 tablet, Rfl: 3  EXAM:  VITALS per patient if applicable:  GENERAL: alert, oriented, appears well and in no acute distress  HEENT: atraumatic, conjunttiva clear, no obvious abnormalities on inspection of external nose and ears  NECK: normal movements of the head and neck  LUNGS: on inspection no signs of respiratory distress, breathing rate appears normal, no obvious gross SOB, gasping or wheezing  CV: no obvious cyanosis  MS: moves all visible extremities without noticeable abnormality  PSYCH/NEURO: pleasant and cooperative, no obvious depression or anxiety, speech and thought processing grossly intact  ASSESSMENT AND PLAN:  Discussed the following assessment and plan:  Problem List Items Addressed This Visit     Attention deficit hyperactivity disorder (ADHD)    We are going to switch the patient to Adderall immediate release 20 mg twice daily.  She can continue the Adderall immediate release 5 mg on an as-needed basis later in the day.  She will monitor for side effects with this medication and let us know if they occur.       Relevant Medications   amphetamine-dextroamphetamine (ADDERALL) 5 MG tablet (Start on 05/20/2021)   amphetamine-dextroamphetamine (ADDERALL) 5 MG tablet (Start on 04/17/2021)   amphetamine-dextroamphetamine (ADDERALL) 5 MG tablet   amphetamine-dextroamphetamine (ADDERALL) 20 MG tablet (Start on 05/20/2021)   amphetamine-dextroamphetamine (ADDERALL) 20 MG tablet (Start on 04/17/2021)   amphetamine-dextroamphetamine (ADDERALL) 20 MG tablet   Hallux valgus    She will proceed with podiatry treatment and evaluation.       Encounter for preconception consultation    The patient saw her GYN for preconception consultation.  She had her IUD removed.  Discussed following her GYN's recommendations regarding stopping her Adderall once she becomes pregnant.        Return in about 3 months (around 06/20/2021).   I discussed the assessment and  treatment plan with the patient. The patient was provided an opportunity to ask questions and all were answered. The patient agreed with the plan and demonstrated an understanding of the instructions.   The patient was advised to call back or seek an in-person evaluation if the symptoms worsen or if the condition fails to improve as anticipated.  Tommi Rumps, MD

## 2021-03-20 NOTE — Assessment & Plan Note (Signed)
The patient saw her GYN for preconception consultation.  She had her IUD removed.  Discussed following her GYN's recommendations regarding stopping her Adderall once she becomes pregnant.

## 2021-03-20 NOTE — Assessment & Plan Note (Signed)
We are going to switch the patient to Adderall immediate release 20 mg twice daily.  She can continue the Adderall immediate release 5 mg on an as-needed basis later in the day.  She will monitor for side effects with this medication and let us know if they occur.

## 2021-03-23 ENCOUNTER — Other Ambulatory Visit: Payer: Self-pay

## 2021-04-21 ENCOUNTER — Encounter: Payer: Self-pay | Admitting: Family Medicine

## 2021-04-21 DIAGNOSIS — F902 Attention-deficit hyperactivity disorder, combined type: Secondary | ICD-10-CM

## 2021-04-22 ENCOUNTER — Other Ambulatory Visit: Payer: Self-pay

## 2021-04-22 MED ORDER — AMPHETAMINE-DEXTROAMPHET ER 20 MG PO CP24
40.0000 mg | ORAL_CAPSULE | ORAL | 0 refills | Status: DC
  Filled 2021-04-22: qty 60, 30d supply, fill #0

## 2021-04-22 NOTE — Telephone Encounter (Signed)
I called the pharmacy and informed them that the patient has been taking off the extended release adderall and all future prescriptions need to be discontinued and that she is now going on adderalll XR, the pharmacist understood.  Gae Bon,  cma

## 2021-04-22 NOTE — Telephone Encounter (Signed)
Please call the pharmacy and have them discontinue all future adderall immediate release 20 mg tablet prescriptions. We are switching her to adderall XR.

## 2021-05-01 ENCOUNTER — Other Ambulatory Visit: Payer: Self-pay

## 2021-05-01 MED ORDER — AMPHETAMINE-DEXTROAMPHETAMINE 5 MG PO TABS
5.0000 mg | ORAL_TABLET | Freq: Every day | ORAL | 0 refills | Status: DC
  Filled 2021-05-01: qty 30, 30d supply, fill #0

## 2021-05-01 NOTE — Addendum Note (Signed)
Addended by: Leone Haven on: 05/01/2021 04:43 PM   Modules accepted: Orders

## 2021-05-11 ENCOUNTER — Other Ambulatory Visit: Payer: Self-pay

## 2021-05-11 MED FILL — Pantoprazole Sodium EC Tab 40 MG (Base Equiv): ORAL | 30 days supply | Qty: 30 | Fill #0 | Status: AC

## 2021-05-22 ENCOUNTER — Other Ambulatory Visit: Payer: Self-pay | Admitting: Family Medicine

## 2021-05-22 ENCOUNTER — Other Ambulatory Visit: Payer: Self-pay

## 2021-05-22 MED FILL — Amphetamine-Dextroamphetamine Cap ER 24HR 20 MG: ORAL | 30 days supply | Qty: 60 | Fill #0 | Status: AC

## 2021-06-11 ENCOUNTER — Other Ambulatory Visit: Payer: Self-pay | Admitting: Family Medicine

## 2021-06-11 ENCOUNTER — Other Ambulatory Visit: Payer: Self-pay

## 2021-06-11 DIAGNOSIS — F902 Attention-deficit hyperactivity disorder, combined type: Secondary | ICD-10-CM

## 2021-06-12 ENCOUNTER — Other Ambulatory Visit: Payer: Self-pay

## 2021-06-12 MED FILL — Amphetamine-Dextroamphetamine Tab 5 MG: ORAL | 30 days supply | Qty: 30 | Fill #0 | Status: AC

## 2021-06-12 NOTE — Telephone Encounter (Signed)
Called to speak with Emie. Call was answered by google assistant and did not connect. Could not leave a voicemail to call back.

## 2021-06-12 NOTE — Telephone Encounter (Signed)
Refill sent to pharmacy. She is due for follow-up. Please contact her to get this scheduled. Thanks.

## 2021-06-19 ENCOUNTER — Other Ambulatory Visit: Payer: Self-pay | Admitting: Family Medicine

## 2021-06-19 ENCOUNTER — Other Ambulatory Visit: Payer: Self-pay

## 2021-06-19 MED FILL — Pantoprazole Sodium EC Tab 40 MG (Base Equiv): ORAL | 30 days supply | Qty: 30 | Fill #1 | Status: AC

## 2021-06-19 MED FILL — Amphetamine-Dextroamphetamine Cap ER 24HR 20 MG: ORAL | Qty: 60 | Fill #0 | Status: CN

## 2021-06-20 ENCOUNTER — Other Ambulatory Visit: Payer: Self-pay

## 2021-06-22 ENCOUNTER — Other Ambulatory Visit: Payer: Self-pay

## 2021-06-22 MED FILL — Amphetamine-Dextroamphetamine Cap ER 24HR 20 MG: ORAL | 30 days supply | Qty: 60 | Fill #0 | Status: AC

## 2021-06-22 NOTE — Telephone Encounter (Signed)
Mychart message has been sent asking the patient to schedule for an appointment.

## 2021-06-30 ENCOUNTER — Encounter: Payer: Self-pay | Admitting: Family Medicine

## 2021-06-30 DIAGNOSIS — M2550 Pain in unspecified joint: Secondary | ICD-10-CM

## 2021-07-03 NOTE — Telephone Encounter (Signed)
I called and LVM for the patient to call back to schedule a slot in October, okay to put in a same day wit h the provider.  Jonique Kulig,cma

## 2021-07-06 ENCOUNTER — Other Ambulatory Visit: Payer: Self-pay

## 2021-07-10 ENCOUNTER — Other Ambulatory Visit
Admission: RE | Admit: 2021-07-10 | Discharge: 2021-07-10 | Disposition: A | Discharge status: Home / Self Care | Payer: 59 | Attending: Family Medicine | Admitting: Family Medicine

## 2021-07-10 DIAGNOSIS — M2550 Pain in unspecified joint: Secondary | ICD-10-CM | POA: Insufficient documentation

## 2021-07-10 LAB — SEDIMENTATION RATE: Sed Rate: 7 mm/hr (ref 0–20)

## 2021-07-11 LAB — RHEUMATOID FACTOR: Rheumatoid fact SerPl-aCnc: <10 IU/mL (ref <14.0)

## 2021-07-12 LAB — CYCLIC CITRUL PEPTIDE ANTIBODY, IGG/IGA: CCP Antibodies IgG/IgA: 3 units (ref 0–19)

## 2021-07-13 ENCOUNTER — Other Ambulatory Visit: Payer: Self-pay

## 2021-07-13 ENCOUNTER — Telehealth: Payer: Self-pay | Admitting: Family Medicine

## 2021-07-13 DIAGNOSIS — F902 Attention-deficit hyperactivity disorder, combined type: Secondary | ICD-10-CM

## 2021-07-13 MED ORDER — AMPHETAMINE-DEXTROAMPHETAMINE 5 MG PO TABS
5.0000 mg | ORAL_TABLET | Freq: Every day | ORAL | 0 refills | Status: DC
Start: 2021-07-13 — End: 2021-08-19
  Filled 2021-07-13: qty 30, 30d supply, fill #0

## 2021-07-13 MED ORDER — AMPHETAMINE-DEXTROAMPHET ER 20 MG PO CP24
ORAL_CAPSULE | ORAL | 0 refills | Status: DC
Start: 2021-07-13 — End: 2021-08-19
  Filled 2021-07-13: qty 60, fill #0
  Filled 2021-07-22: qty 60, 30d supply, fill #0

## 2021-07-13 NOTE — Telephone Encounter (Signed)
Patients husband is calling in to schedule a med refill visit for her within 3 months and the next opening will be 11/22.She will be out of her amphetamine-dextroamphetamine (ADDERALL XR) 20 MG 24 hr capsule and amphetamine-dextroamphetamine (ADDERALL) 5 MG tablet before her 11/22 appointment.Please send to [facility] for the patient.

## 2021-07-13 NOTE — Telephone Encounter (Signed)
Refill sent to the pharmacy.  Are there not any 15-minute slots to put her in in October?

## 2021-07-14 ENCOUNTER — Other Ambulatory Visit: Payer: Self-pay

## 2021-07-14 LAB — ANA: Anti Nuclear Antibody (ANA): NEGATIVE

## 2021-07-14 NOTE — Telephone Encounter (Signed)
I called and LVM for patient to call to schedule a f/up in a sam day slot for this month.  Ramona Ruark,cma

## 2021-07-16 NOTE — Telephone Encounter (Signed)
I called and LVM for the patient to call back to schedule a visit  this month with the provider in a same day slot.  Deiondre Harrower,cma

## 2021-07-22 ENCOUNTER — Other Ambulatory Visit: Payer: Self-pay

## 2021-08-04 ENCOUNTER — Other Ambulatory Visit: Payer: Self-pay

## 2021-08-19 ENCOUNTER — Telehealth (INDEPENDENT_AMBULATORY_CARE_PROVIDER_SITE_OTHER): Payer: 59 | Admitting: Family Medicine

## 2021-08-19 ENCOUNTER — Encounter: Payer: Self-pay | Admitting: Family Medicine

## 2021-08-19 ENCOUNTER — Other Ambulatory Visit: Payer: Self-pay

## 2021-08-19 DIAGNOSIS — F419 Anxiety disorder, unspecified: Secondary | ICD-10-CM

## 2021-08-19 DIAGNOSIS — Z3490 Encounter for supervision of normal pregnancy, unspecified, unspecified trimester: Secondary | ICD-10-CM | POA: Insufficient documentation

## 2021-08-19 DIAGNOSIS — F32A Depression, unspecified: Secondary | ICD-10-CM | POA: Diagnosis not present

## 2021-08-19 DIAGNOSIS — Z3A08 8 weeks gestation of pregnancy: Secondary | ICD-10-CM | POA: Diagnosis not present

## 2021-08-19 DIAGNOSIS — F902 Attention-deficit hyperactivity disorder, combined type: Secondary | ICD-10-CM | POA: Diagnosis not present

## 2021-08-19 NOTE — Progress Notes (Signed)
Virtual Visit via telephone Note  This visit type was conducted due to national recommendations for restrictions regarding the COVID-19 pandemic (e.g. social distancing).  This format is felt to be most appropriate for this patient at this time.  All issues noted in this document were discussed and addressed.  No physical exam was performed (except for noted visual exam findings with Video Visits).   I connected with Jacqueline Craig today at  3:45 PM EST by telephone and verified that I am speaking with the correct person using two identifiers. Location patient: home Location provider: work Persons participating in the virtual visit: patient, provider  I discussed the limitations, risks, security and privacy concerns of performing an evaluation and management service by telephone and the availability of in person appointments. I also discussed with the patient that there may be a patient responsible charge related to this service. The patient expressed understanding and agreed to proceed.  Interactive audio and video telecommunications were attempted between this provider and patient, however failed, due to patient having technical difficulties OR patient did not have access to video capability.  We continued and completed visit with audio only.   Reason for visit: f/u  HPI: ADHD: Patient is currently pregnant.  She stopped her Adderall very early on.  She notes she has been doing fairly well with being off of the medication.  She has changed jobs and is still in the onboarding process at [employer].  She is hopeful she will be able to handle things fairly well when she starts actively working.  Anxiety/depression: She notes no symptoms.  She notes she has discussed the Wellbutrin with her OB and they advised she could continue on this.  Pregnancy: She sees her OB this week for her first ultrasound.   ROS: See pertinent positives and negatives per HPI.  Past Medical History:  Diagnosis Date    Bilateral bunions    Chickenpox    GERD (gastroesophageal reflux disease)    Raynaud phenomenon    UTI (lower urinary tract infection)     Past Surgical History:  Procedure Laterality Date   COLONOSCOPY WITH PROPOFOL N/A 06/30/2020   Procedure: COLONOSCOPY WITH PROPOFOL;  Surgeon: Lin Landsman, MD;  Location: [facility] ENDOSCOPY;  Service: Gastroenterology;  Laterality: N/A;    Family History  Problem Relation Age of Onset   Alcoholism Other        Parent   Arthritis Other        Grandparent   Prostate cancer Other        Father   Hyperlipidemia Other        Grandparent   Heart disease Other        Grandparent   Stroke Other        Grandparent   Hypertension Other        Grandparent   Mental illness Other        Other relative   Diabetes Other        Grandparent    SOCIAL HX: Former smoker   Current Outpatient Medications:    buPROPion (WELLBUTRIN XL) 300 MG 24 hr tablet, TAKE 1 TABLET BY MOUTH DAILY, Disp: 90 tablet, Rfl: 3   Prenatal Vit-Fe Fumarate-FA (PRENATAL 1+1 PO), Prenatal, Disp: , Rfl:    valACYclovir (VALTREX) 1000 MG tablet, Take 2 tablets (2,000 mg total) by mouth 2 (two) times daily. For one day at first sign of lesions., Disp: 10 tablet, Rfl: 1  EXAM: This was a telephone visit and  thus no exam was completed.  ASSESSMENT AND PLAN:  Discussed the following assessment and plan:  Problem List Items Addressed This Visit     Anxiety and depression    Very well controlled.  I encouraged her to discuss the Wellbutrin with her OB later this week to ensure that it is safe to remain on.      Attention deficit hyperactivity disorder (ADHD)    She will remain off of her medication for the duration of her pregnancy.      Pregnancy    She will see her OB later this week as planned.  I discussed that the patient could follow-up with [place] if needed during her pregnancy for nonpregnancy related issues.       No follow-ups on file.   I discussed the  assessment and treatment plan with the patient. The patient was provided an opportunity to ask questions and all were answered. The patient agreed with the plan and demonstrated an understanding of the instructions.   The patient was advised to call back or seek an in-person evaluation if the symptoms worsen or if the condition fails to improve as anticipated.  I provided 7 minutes of non-face-to-face time during this encounter.   Tommi Rumps, MD

## 2021-08-19 NOTE — Assessment & Plan Note (Signed)
Very well controlled.  I encouraged her to discuss the Wellbutrin with her OB later this week to ensure that it is safe to remain on.

## 2021-08-19 NOTE — Assessment & Plan Note (Signed)
She will remain off of her medication for the duration of her pregnancy.

## 2021-08-19 NOTE — Assessment & Plan Note (Signed)
She will see her OB later this week as planned.  I discussed that the patient could follow-up with [place] if needed during her pregnancy for nonpregnancy related issues.

## 2021-09-01 ENCOUNTER — Telehealth: Payer: 59 | Admitting: Family Medicine

## 2021-09-01 LAB — HEPATITIS C ANTIBODY: HCV Ab: NEGATIVE

## 2021-09-01 LAB — OB RESULTS CONSOLE ABO/RH: RH Type: POSITIVE

## 2021-09-01 LAB — OB RESULTS CONSOLE RPR: RPR: NONREACTIVE

## 2021-09-01 LAB — OB RESULTS CONSOLE RUBELLA ANTIBODY, IGM: Rubella: IMMUNE

## 2021-09-01 LAB — OB RESULTS CONSOLE ANTIBODY SCREEN: Antibody Screen: NEGATIVE

## 2021-09-01 LAB — OB RESULTS CONSOLE HIV ANTIBODY (ROUTINE TESTING): HIV: NONREACTIVE

## 2021-09-01 LAB — OB RESULTS CONSOLE HEPATITIS B SURFACE ANTIGEN: Hepatitis B Surface Ag: NEGATIVE

## 2021-09-02 ENCOUNTER — Other Ambulatory Visit: Payer: Self-pay

## 2021-09-21 LAB — OB RESULTS CONSOLE GC/CHLAMYDIA
Chlamydia: NEGATIVE
Neisseria Gonorrhea: NEGATIVE

## 2021-10-12 ENCOUNTER — Other Ambulatory Visit: Payer: Self-pay

## 2021-12-22 ENCOUNTER — Ambulatory Visit: Payer: 59

## 2021-12-22 ENCOUNTER — Encounter: Payer: Self-pay | Admitting: Adult Health

## 2021-12-22 ENCOUNTER — Telehealth (INDEPENDENT_AMBULATORY_CARE_PROVIDER_SITE_OTHER): Payer: 59 | Admitting: Adult Health

## 2021-12-22 DIAGNOSIS — J029 Acute pharyngitis, unspecified: Secondary | ICD-10-CM

## 2021-12-22 DIAGNOSIS — Z3A25 25 weeks gestation of pregnancy: Secondary | ICD-10-CM | POA: Insufficient documentation

## 2021-12-22 DIAGNOSIS — R067 Sneezing: Secondary | ICD-10-CM | POA: Insufficient documentation

## 2021-12-22 DIAGNOSIS — T732XXA Exhaustion due to exposure, initial encounter: Secondary | ICD-10-CM

## 2021-12-22 LAB — POCT RAPID STREP A (OFFICE): Rapid Strep A Screen: NEGATIVE

## 2021-12-22 NOTE — Progress Notes (Signed)
Rapid strep throat negative.  ?Lab strep, covid, rsv, and flu sent out and still pending.

## 2021-12-22 NOTE — Progress Notes (Signed)
Virtual Visit via Telephone Note ? ?I connected with Jacqueline Craig on 12/22/21 at 10:00 AM EDT by telephone and verified that I am speaking with the correct person using two identifiers. ? ?Location: ?Patient: at home  ?Provider: Provider: Provider's office at  [facility], [place]. ?  ? ?  ?I discussed the limitations, risks, security and privacy concerns of performing an evaluation and management service by telephone and the availability of in person appointments. I also discussed with the patient that there may be a patient responsible charge related to this service. The patient expressed understanding and agreed to proceed. ? ? ?History of Present Illness: ?Patient is a 35 year old female in no acute distress, she started with a mild sore throat yesterday 12/22/2021. She has a worsening sore throat, she has painful swallowing.  ?She has been sneezing, denies any drainage.  ?Fatigue.  ?No problems swallowing.  ?She denies any history of strep since her early 20's.  ?Afebrile.  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  ?[redacted] weeks pregnant no complications with pregnancy.  ? ?Patient  denies any fever, body aches,chills, rash, chest pain, shortness of breath, nausea, vomiting, or diarrhea.   ?Denies dizziness, lightheadedness, pre syncopal or syncopal episodes.   ?  ?Observations/Objective: ? ?Patient is alert and oriented and responsive to questions Engages in conversation with provider. Speaks in full sentences without any pauses without any shortness of breath or distress.   ? ?Assessment and Plan: ? ? ?1. 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  ?- POCT rapid strep A ?- Culture, Group A Strep ?- COVID-19, Flu A+B and RSV ? ?2. [redacted] weeks gestation of pregnancy ?Follow up with  obstetrician if any pregnancy concerns. ?- POCT rapid strep A ?- Culture, Group A Strep ?- COVID-19, Flu A+B and RSV ? ?3. Sneezing ? ?- POCT rapid strep A ?- Culture, Group A Strep ?- COVID-19, Flu A+B and RSV ? ?4. Fatigue due to exposure, initial encounter ?- POCT rapid strep A ?- Culture, Group A Strep ?- COVID-19, Flu A+B and RSV  ? ?Symptoms are relatively mild at this time.  Advised her to try warm salt water gargles 3-4 times a day for sore throat.  She has a list of medications that are safe with her pregnancy she is able to use these as well.  We will test for strep prior she is treating given she is pregnant and increases the risk. ? ?Rest and remain hydrated. ?Follow Up Instructions: ? ?  ?I discussed the assessment and treatment plan with the patient. The patient was provided an opportunity to ask questions and all were answered. The patient agreed with the plan and demonstrated an understanding of the instructions. ?  ?The patient was advised to call back or seek an in-person evaluation if the symptoms worsen or if the condition fails to improve as anticipated. ? ?After visit summary to [facility]. ? ?Advised in person evaluation at anytime is advised if any symptoms do not improve, worsen or change at any given time.  ?Red Flags discussed. The patient was given clear instructions to go to ER or return to medical center if any red flags develop, symptoms do not improve, worsen or new problems develop. They verbalized understanding.  ? ? ?  Marcille Buffy, FNP  ?

## 2021-12-22 NOTE — Patient Instructions (Addendum)
Orders Placed This Encounter  ?Procedures  ? Culture, Group A Strep  ?  Order Specific Question:   Source  ?  Answer:   throat  ? COVID-19, Flu A+B and RSV  ?  Order Specific Question:   Previously tested for COVID-19  ?  Answer:   Yes  ?  Order Specific Question:   Resident in a congregate (group) care setting  ?  Answer:   No  ?  Order Specific Question:   Is the patient student?  ?  Answer:   No  ?  Order Specific Question:   Employed in healthcare setting  ?  Answer:   No  ?  Order Specific Question:   Pregnant  ?  Answer:   Yes  ?  Order Specific Question:   Has patient completed COVID vaccination(s) (2 doses of Pfizer/Moderna 1 dose of [employer])  ?  Answer:   No  ?  Order Specific Question:   Release to patient  ?  Answer:   Immediate  ? POCT rapid strep A  ?  ? ?Warm salt water gargles 3- 4 times day. Medications over the counter for symptom management approved by your OB GYN is okay.  ?Return in about 3 days (around 12/25/2021), or if symptoms worsen or fail to improve, for at any time for any worsening symptoms, Go to Emergency room/ urgent care if worse.  ? ?Sore Throat ?A sore throat is pain, burning, irritation, or scratchiness in the throat. When you have a sore throat, you may feel pain or tenderness in your throat when you swallow or talk. ?Many things can cause a sore throat, including: ?An infection. ?Seasonal allergies. ?Dryness in the air. ?Irritants, such as smoke or pollution. ?Radiation treatment for cancer. ?Gastroesophageal reflux disease (GERD). ?A tumor. ?A sore throat is often the first sign of another sickness. It may happen with other symptoms, such as coughing, sneezing, fever, and swollen neck glands. Most sore throats go away without medical treatment. ?Follow these instructions at home: ?  ?Medicines ?Take over-the-counter and prescription medicines only as told by your health care provider. ?Children often get sore throats. Do not give your child aspirin because of the  association with Reye's syndrome. ?Use throat sprays to soothe your throat as told by your health care provider. ?Managing pain ?To help with pain, try: ?Sipping warm liquids, such as broth, herbal tea, or warm water. ?Eating or drinking cold or frozen liquids, such as frozen ice pops. ?Gargling with a mixture of salt and water 3-4 times a day or as needed. To make salt water, completely dissolve ?-1 tsp (3-6 g) of salt in 1 cup (237 mL) of warm water. ?Sucking on hard candy or throat lozenges. ?Putting a cool-mist humidifier in your bedroom at night to moisten the air. ?Sitting in the bathroom with the door closed for 5-10 minutes while you run hot water in the shower. ?General instructions ?Do not use any products that contain nicotine or tobacco. These products include cigarettes, chewing tobacco, and vaping devices, such as e-cigarettes. If you need help quitting, ask your health care provider. ?Rest as needed. ?Drink enough fluid to keep your urine pale yellow. ?Wash your hands often with soap and water for at least 20 seconds. If soap and water are not available, use hand sanitizer. ?Contact a health care provider if: ?You have a fever for more than 2-3 days. ?You have symptoms that last for more than 2-3 days. ?Your throat does not  get better within 7 days. ?You have a fever and your symptoms suddenly get worse. ?Get help right away if: ?You have difficulty breathing. ?You cannot swallow fluids, soft foods, or your saliva. ?You have increased swelling in your throat or neck. ?You have persistent nausea and vomiting. ?These symptoms may represent a serious problem that is an emergency. Do not wait to see if the symptoms will go away. Get medical help right away. Call your local emergency services (911 in the [place] Do not drive yourself to the hospital. ?Summary ?A sore throat is pain, burning, irritation, or scratchiness in the throat. Many things can cause a sore throat. ?Take over-the-counter medicines only  as told by your health care provider. ?Rest as needed. ?Drink enough fluid to keep your urine pale yellow. ?Contact a health care provider if your throat does not get better within 7 days. ?This information is not intended to replace advice given to you by your health care provider. Make sure you discuss any questions you have with your health care provider. ?Document Revised: 12/24/2020 Document Reviewed: 12/24/2020 ?Elsevier Patient Education ? [place]. ? ?

## 2021-12-24 LAB — COVID-19, FLU A+B AND RSV
Influenza A, NAA: NOT DETECTED
Influenza B, NAA: NOT DETECTED
RSV, NAA: NOT DETECTED
SARS-CoV-2, NAA: NOT DETECTED

## 2021-12-24 NOTE — Progress Notes (Signed)
Negative covid, flu and rsv.  ?Strep pending.

## 2021-12-25 LAB — CULTURE, GROUP A STREP: Strep A Culture: NEGATIVE

## 2022-03-17 ENCOUNTER — Encounter (HOSPITAL_COMMUNITY): Payer: Self-pay | Admitting: *Deleted

## 2022-03-17 ENCOUNTER — Telehealth (HOSPITAL_COMMUNITY): Payer: Self-pay | Admitting: *Deleted

## 2022-03-17 NOTE — Telephone Encounter (Signed)
Preadmission screen  

## 2022-03-18 ENCOUNTER — Encounter (HOSPITAL_COMMUNITY): Payer: Self-pay | Admitting: *Deleted

## 2022-03-22 ENCOUNTER — Other Ambulatory Visit: Payer: Self-pay | Admitting: Family Medicine

## 2022-03-31 NOTE — H&P (Signed)
Jacqueline Craig is a 36 y.o. female presenting for elective IOL. She has a history of chronic pain requiring local steroid injections with ortho this pregnancy. She has depression well controlled with bupropion. She is ama and had a low risk panorama. 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     OB History     Gravida  1   Para      Term      Preterm      AB      Living         SAB      IAB      Ectopic      Multiple      Live Births             Past Medical History:  Diagnosis Date   Bilateral bunions    Chickenpox    GERD (gastroesophageal reflux disease)    Raynaud phenomenon    UTI (lower urinary tract infection)    Past Surgical History:  Procedure Laterality Date   COLONOSCOPY WITH PROPOFOL N/A 06/30/2020   Procedure: COLONOSCOPY WITH PROPOFOL;  Surgeon: Lin Landsman, MD;  Location: [facility] ENDOSCOPY;  Service: Gastroenterology;  Laterality: N/A;   Family History: family history includes Alcoholism in an other family member; Arthritis in an other family member; Diabetes in an other family member; Heart disease in an other family member; Hyperlipidemia in an other family member; Hypertension in an other family member; Mental illness in an other family member; Prostate cancer in an other family member; Stroke in an other family member. Social History: noncontributory     Maternal Diabetes: No Genetic Screening: Normal Maternal Ultrasounds/Referrals: Normal Fetal Ultrasounds or other Referrals:  None Maternal Substance Abuse:  No Significant Maternal Medications:  None Significant Maternal Lab Results:  Group B Strep negative Other Comments:  None  Review of Systems History   There were no vitals taken for this visit. Exam Physical Exam  (from office) NAD, A&O NWOB Abd soft, nondistended, gravid  Prenatal labs: ABO, Rh: O/Positive/-- (11/22 0000) Antibody: Negative (11/22 0000) Rubella: Immune (11/22 0000) RPR: Nonreactive (11/22  0000)  HBsAg: Negative (11/22 0000)  HIV: Non-reactive (11/22 0000)  GBS:     Assessment/Plan: 35 yo G1P0 @ 52 wga presenting for IOL s/s elective. Cervix unfavorable. Plan for cytotec followed by pitocin/AROM when more favorable.  GBS negative.     Tyson Dense 03/31/2022, 3:10 PM

## 2022-04-02 ENCOUNTER — Inpatient Hospital Stay (HOSPITAL_COMMUNITY): Payer: 59 | Admitting: Anesthesiology

## 2022-04-02 ENCOUNTER — Inpatient Hospital Stay (HOSPITAL_COMMUNITY)
Admission: AD | Admit: 2022-04-02 | Discharge: 2022-04-05 | DRG: 806 | Disposition: A | Discharge status: Home / Self Care | Payer: 59 | Attending: Obstetrics and Gynecology | Admitting: Obstetrics and Gynecology

## 2022-04-02 ENCOUNTER — Inpatient Hospital Stay (HOSPITAL_COMMUNITY): Payer: 59

## 2022-04-02 DIAGNOSIS — Z3A39 39 weeks gestation of pregnancy: Secondary | ICD-10-CM | POA: Diagnosis not present

## 2022-04-02 DIAGNOSIS — O872 Hemorrhoids in the puerperium: Secondary | ICD-10-CM | POA: Diagnosis not present

## 2022-04-02 DIAGNOSIS — F32A Depression, unspecified: Secondary | ICD-10-CM | POA: Diagnosis present

## 2022-04-02 DIAGNOSIS — O99344 Other mental disorders complicating childbirth: Secondary | ICD-10-CM | POA: Diagnosis present

## 2022-04-02 DIAGNOSIS — O26893 Other specified pregnancy related conditions, third trimester: Secondary | ICD-10-CM | POA: Diagnosis present

## 2022-04-02 DIAGNOSIS — Z3490 Encounter for supervision of normal pregnancy, unspecified, unspecified trimester: Principal | ICD-10-CM

## 2022-04-02 LAB — CBC
HCT: 37 % (ref 36.0–46.0)
Hemoglobin: 12.5 g/dL (ref 12.0–15.0)
MCH: 32.6 pg (ref 26.0–34.0)
MCHC: 33.8 g/dL (ref 30.0–36.0)
MCV: 96.6 fL (ref 80.0–100.0)
Platelets: 255 10*3/uL (ref 150–400)
RBC: 3.83 MIL/uL — ABNORMAL LOW (ref 3.87–5.11)
RDW: 13.2 % (ref 11.5–15.5)
WBC: 11.4 10*3/uL — ABNORMAL HIGH (ref 4.0–10.5)
nRBC: 0 % (ref 0.0–0.2)

## 2022-04-02 LAB — TYPE AND SCREEN
ABO/RH(D): O POS
Antibody Screen: NEGATIVE

## 2022-04-02 LAB — RPR: RPR Ser Ql: NONREACTIVE

## 2022-04-02 MED ORDER — OXYTOCIN-SODIUM CHLORIDE 30-0.9 UT/500ML-% IV SOLN
1.0000 m[IU]/min | INTRAVENOUS | Status: DC
  Administered 2022-04-02: 2 m[IU]/min via INTRAVENOUS
  Filled 2022-04-02: qty 500

## 2022-04-02 MED ORDER — LACTATED RINGERS IV SOLN
500.0000 mL | Freq: Once | INTRAVENOUS | Status: AC
  Administered 2022-04-02: 500 mL via INTRAVENOUS

## 2022-04-02 MED ORDER — ACETAMINOPHEN 325 MG PO TABS
650.0000 mg | ORAL_TABLET | ORAL | Status: DC | PRN
  Administered 2022-04-03: 650 mg via ORAL
  Filled 2022-04-02: qty 2

## 2022-04-02 MED ORDER — LIDOCAINE HCL (PF) 1 % IJ SOLN
INTRAMUSCULAR | Status: DC | PRN
  Administered 2022-04-02: 10 mL via EPIDURAL
  Administered 2022-04-02: 2 mL via EPIDURAL

## 2022-04-02 MED ORDER — EPHEDRINE 5 MG/ML INJ: 10.0000 mg | INTRAVENOUS | Status: DC | PRN

## 2022-04-02 MED ORDER — TERBUTALINE SULFATE 1 MG/ML IJ SOLN: 0.2500 mg | Freq: Once | INTRAMUSCULAR | Status: DC | PRN

## 2022-04-02 MED ORDER — PHENYLEPHRINE 80 MCG/ML (10ML) SYRINGE FOR IV PUSH (FOR BLOOD PRESSURE SUPPORT): 80.0000 ug | PREFILLED_SYRINGE | INTRAVENOUS | Status: DC | PRN

## 2022-04-02 MED ORDER — OXYTOCIN BOLUS FROM INFUSION
333.0000 mL | Freq: Once | INTRAVENOUS | Status: AC
  Administered 2022-04-02: 333 mL via INTRAVENOUS

## 2022-04-02 MED ORDER — OXYCODONE-ACETAMINOPHEN 5-325 MG PO TABS: 1.0000 | ORAL_TABLET | ORAL | Status: DC | PRN

## 2022-04-02 MED ORDER — TRANEXAMIC ACID-NACL 1000-0.7 MG/100ML-% IV SOLN
1000.0000 mg | Freq: Once | INTRAVENOUS | Status: AC
Start: 2022-04-03 — End: 2022-04-02
  Administered 2022-04-02: 1000 mg via INTRAVENOUS

## 2022-04-02 MED ORDER — LACTATED RINGERS IV SOLN
500.0000 mL | INTRAVENOUS | Status: DC | PRN
  Administered 2022-04-02: 500 mL via INTRAVENOUS

## 2022-04-02 MED ORDER — OXYCODONE-ACETAMINOPHEN 5-325 MG PO TABS: 2.0000 | ORAL_TABLET | ORAL | Status: DC | PRN

## 2022-04-02 MED ORDER — ONDANSETRON HCL 4 MG/2ML IJ SOLN
4.0000 mg | Freq: Four times a day (QID) | INTRAMUSCULAR | Status: DC | PRN
  Administered 2022-04-02 (×2): 4 mg via INTRAVENOUS
  Filled 2022-04-02 (×2): qty 2

## 2022-04-02 MED ORDER — LIDOCAINE HCL (PF) 1 % IJ SOLN: 30.0000 mL | INTRAMUSCULAR | Status: DC | PRN

## 2022-04-02 MED ORDER — OXYTOCIN-SODIUM CHLORIDE 30-0.9 UT/500ML-% IV SOLN: 2.5000 [IU]/h | INTRAVENOUS | Status: DC

## 2022-04-02 MED ORDER — FENTANYL-BUPIVACAINE-NACL 0.5-0.125-0.9 MG/250ML-% EP SOLN
12.0000 mL/h | EPIDURAL | Status: DC | PRN
  Filled 2022-04-02: qty 250

## 2022-04-02 MED ORDER — ZOLPIDEM TARTRATE 5 MG PO TABS
5.0000 mg | ORAL_TABLET | Freq: Every evening | ORAL | Status: DC | PRN
  Administered 2022-04-02: 5 mg via ORAL
  Filled 2022-04-02: qty 1

## 2022-04-02 MED ORDER — HYDROXYZINE HCL 50 MG PO TABS: 50.0000 mg | ORAL_TABLET | Freq: Four times a day (QID) | ORAL | Status: DC | PRN

## 2022-04-02 MED ORDER — LACTATED RINGERS IV SOLN: INTRAVENOUS | Status: DC

## 2022-04-02 MED ORDER — FENTANYL-BUPIVACAINE-NACL 0.5-0.125-0.9 MG/250ML-% EP SOLN
EPIDURAL | Status: DC | PRN
  Administered 2022-04-02: 12 mL/h via EPIDURAL

## 2022-04-02 MED ORDER — SOD CITRATE-CITRIC ACID 500-334 MG/5ML PO SOLN: 30.0000 mL | ORAL | Status: DC | PRN

## 2022-04-02 MED ORDER — DIPHENHYDRAMINE HCL 50 MG/ML IJ SOLN: 12.5000 mg | INTRAMUSCULAR | Status: DC | PRN

## 2022-04-02 MED ORDER — PHENYLEPHRINE 80 MCG/ML (10ML) SYRINGE FOR IV PUSH (FOR BLOOD PRESSURE SUPPORT)
80.0000 ug | PREFILLED_SYRINGE | INTRAVENOUS | Status: DC | PRN
  Filled 2022-04-02: qty 10

## 2022-04-02 MED ORDER — MISOPROSTOL 50MCG HALF TABLET
50.0000 ug | ORAL_TABLET | ORAL | Status: DC | PRN
  Administered 2022-04-02 (×2): 50 ug via ORAL
  Filled 2022-04-02 (×2): qty 1

## 2022-04-02 NOTE — Progress Notes (Signed)
Doing well.  S/p cytotec x 2.

## 2022-04-02 NOTE — Progress Notes (Signed)
SVE 9/c/0 IUPC with inadequate contractions but getting closer Continue pitocin - currently at 20

## 2022-04-03 ENCOUNTER — Encounter (HOSPITAL_COMMUNITY): Payer: Self-pay | Admitting: Obstetrics and Gynecology

## 2022-04-03 LAB — CBC
HCT: 29.7 % — ABNORMAL LOW (ref 36.0–46.0)
Hemoglobin: 10.2 g/dL — ABNORMAL LOW (ref 12.0–15.0)
MCH: 32.9 pg (ref 26.0–34.0)
MCHC: 34.3 g/dL (ref 30.0–36.0)
MCV: 95.8 fL (ref 80.0–100.0)
Platelets: 189 10*3/uL (ref 150–400)
RBC: 3.1 MIL/uL — ABNORMAL LOW (ref 3.87–5.11)
RDW: 13.1 % (ref 11.5–15.5)
WBC: 18.5 10*3/uL — ABNORMAL HIGH (ref 4.0–10.5)
nRBC: 0 % (ref 0.0–0.2)

## 2022-04-03 MED ORDER — SIMETHICONE 80 MG PO CHEW: 80.0000 mg | CHEWABLE_TABLET | ORAL | Status: DC | PRN

## 2022-04-03 MED ORDER — ZOLPIDEM TARTRATE 5 MG PO TABS: 5.0000 mg | ORAL_TABLET | Freq: Every evening | ORAL | Status: DC | PRN

## 2022-04-03 MED ORDER — BENZOCAINE-MENTHOL 20-0.5 % EX AERO
1.0000 | INHALATION_SPRAY | CUTANEOUS | Status: DC | PRN
  Administered 2022-04-03: 1 via TOPICAL
  Filled 2022-04-03 (×2): qty 56

## 2022-04-03 MED ORDER — COCONUT OIL OIL: 1.0000 | TOPICAL_OIL | Status: DC | PRN

## 2022-04-03 MED ORDER — SENNOSIDES-DOCUSATE SODIUM 8.6-50 MG PO TABS
2.0000 | ORAL_TABLET | ORAL | Status: DC
  Administered 2022-04-03 – 2022-04-05 (×3): 2 via ORAL
  Filled 2022-04-03 (×3): qty 2

## 2022-04-03 MED ORDER — DIBUCAINE (PERIANAL) 1 % EX OINT
1.0000 | TOPICAL_OINTMENT | CUTANEOUS | Status: DC | PRN
  Administered 2022-04-03: 1 via RECTAL
  Filled 2022-04-03 (×2): qty 28

## 2022-04-03 MED ORDER — WITCH HAZEL-GLYCERIN EX PADS: 1.0000 | MEDICATED_PAD | CUTANEOUS | Status: DC | PRN

## 2022-04-03 MED ORDER — ONDANSETRON HCL 4 MG/2ML IJ SOLN: 4.0000 mg | INTRAMUSCULAR | Status: DC | PRN

## 2022-04-03 MED ORDER — OXYCODONE HCL 5 MG PO TABS
10.0000 mg | ORAL_TABLET | ORAL | Status: DC | PRN
  Administered 2022-04-03 – 2022-04-05 (×7): 10 mg via ORAL
  Filled 2022-04-03 (×8): qty 2

## 2022-04-03 MED ORDER — PRENATAL MULTIVITAMIN CH
1.0000 | ORAL_TABLET | Freq: Every day | ORAL | Status: DC
  Administered 2022-04-03 – 2022-04-05 (×3): 1 via ORAL
  Filled 2022-04-03 (×3): qty 1

## 2022-04-03 MED ORDER — DIPHENHYDRAMINE HCL 25 MG PO CAPS: 25.0000 mg | ORAL_CAPSULE | Freq: Four times a day (QID) | ORAL | Status: DC | PRN

## 2022-04-03 MED ORDER — OXYCODONE HCL 5 MG PO TABS
5.0000 mg | ORAL_TABLET | ORAL | Status: DC | PRN
  Administered 2022-04-03 – 2022-04-04 (×5): 5 mg via ORAL
  Filled 2022-04-03 (×4): qty 1

## 2022-04-03 MED ORDER — BUPROPION HCL ER (XL) 300 MG PO TB24
300.0000 mg | ORAL_TABLET | Freq: Every day | ORAL | Status: DC
  Administered 2022-04-03 – 2022-04-05 (×3): 300 mg via ORAL
  Filled 2022-04-03 (×4): qty 1

## 2022-04-03 MED ORDER — IBUPROFEN 600 MG PO TABS
600.0000 mg | ORAL_TABLET | Freq: Four times a day (QID) | ORAL | Status: DC
  Administered 2022-04-03 – 2022-04-05 (×9): 600 mg via ORAL
  Filled 2022-04-03 (×10): qty 1

## 2022-04-03 MED ORDER — ONDANSETRON HCL 4 MG PO TABS: 4.0000 mg | ORAL_TABLET | ORAL | Status: DC | PRN

## 2022-04-03 MED ORDER — ACETAMINOPHEN 325 MG PO TABS
650.0000 mg | ORAL_TABLET | ORAL | Status: DC | PRN
  Administered 2022-04-03 – 2022-04-05 (×9): 650 mg via ORAL
  Filled 2022-04-03 (×9): qty 2

## 2022-04-03 MED ORDER — TETANUS-DIPHTH-ACELL PERTUSSIS 5-2.5-18.5 LF-MCG/0.5 IM SUSY: 0.5000 mL | PREFILLED_SYRINGE | Freq: Once | INTRAMUSCULAR | Status: DC

## 2022-04-03 NOTE — Lactation Note (Addendum)
This note was copied from a baby's chart. Lactation Consultation Note  Patient Name: Jacqueline Craig [id number] Date: 04/03/2022 Reason for consult: Follow-up assessment;Mother's request;Difficult latch;Term;Breastfeeding assistance Age:38 hours LC reviewed chart but was called to 507. Other LC to follow up with Mother.     Maternal Data Has patient been taught Hand Expression?: Yes  Feeding Mother's Current Feeding Choice: Breast Milk  LATCH Score Latch: Repeated attempts needed to sustain latch, nipple held in mouth throughout feeding, stimulation needed to elicit sucking reflex.  Audible Swallowing: Spontaneous and intermittent  Type of Nipple: Everted at rest and after stimulation  Comfort (Breast/Nipple): Filling, red/small blisters or bruises, mild/mod discomfort  Hold (Positioning): Assistance needed to correctly position infant at breast and maintain latch.  LATCH Score: 7   Lactation Tools Discussed/Used Tools: Pump;Flanges;Coconut oil Flange Size: 24 Breast pump type: Manual Pump Education: Setup, frequency, and cleaning;Milk Storage Reason for Pumping: increase stimulation Pumping frequency: post pump after feeding for 15 mins  Interventions Interventions: Breast feeding basics reviewed;Assisted with latch;Skin to skin;Breast massage;Hand express;Breast compression;Adjust position;Support pillows;Position options;Expressed milk;Coconut oil;Hand pump;Education;Pace feeding;LC Psychologist, educational;Infant Driven Feeding Algorithm education  Discharge Discharge Education: Engorgement and breast care;Warning signs for feeding baby;Outpatient recommendation;Outpatient Epic message sent Pump: Manual;Personal  Consult Status Consult Status: Complete Date: 04/03/22    Robena Ewy  Nicholson-Springer 04/03/2022, 5:26 PM

## 2022-04-03 NOTE — Anesthesia Postprocedure Evaluation (Signed)
Anesthesia Post Note  Patient: Jacqueline Craig  Procedure(s) Performed: AN AD HOC LABOR EPIDURAL     Patient location during evaluation: Mother Baby Anesthesia Type: Epidural Level of consciousness: awake and alert Pain management: pain level controlled Vital Signs Assessment: post-procedure vital signs reviewed and stable Respiratory status: spontaneous breathing, nonlabored ventilation and respiratory function stable Cardiovascular status: stable Postop Assessment: no headache, no backache and epidural receding Anesthetic complications: no   No notable events documented.  Last Vitals:  Vitals:   04/03/22 0302 04/03/22 0720  BP: 114/80 126/84  Pulse: 72 69  Resp: 18 16  Temp: 36.8 C 36.5 C  SpO2: 99% 98%    Last Pain:  Vitals:   04/03/22 0720  TempSrc: Oral  PainSc: 4    Pain Goal: Patients Stated Pain Goal: 2 (04/03/22 0720)                 Rica Records

## 2022-04-04 NOTE — Lactation Note (Signed)
This note was copied from a baby's chart. Lactation Consultation Note  Patient Name: Jacqueline Craig Lorig [id number] Date: 04/04/2022 Reason for consult: Follow-up assessment;Other (Comment) (for the day shift attempted to see mom x 2 and she was sleeping. Dad set up and aware LC was present. Mom will be followup.) Age:38 hours LC reviewed the doc flow sheets and baby has been consistent to the breast  Maternal Data    Feeding Mother's Current Feeding Choice: Breast Milk  LATCH Score                    Lactation Tools Discussed/Used    Interventions    Discharge    Consult Status Consult Status: Follow-up Date: 04/04/22 Follow-up type: In-patient    Matilde Sprang Lysette Lindenbaum 04/04/2022, 3:30 PM

## 2022-04-05 MED ORDER — IBUPROFEN 600 MG PO TABS
600.0000 mg | ORAL_TABLET | Freq: Four times a day (QID) | ORAL | 0 refills | Status: AC
Start: 2022-04-05 — End: ?

## 2022-04-05 MED ORDER — ACETAMINOPHEN 325 MG PO TABS: 650.0000 mg | ORAL_TABLET | ORAL | 0 refills | Status: DC | PRN

## 2022-04-05 MED ORDER — OXYCODONE HCL 5 MG PO TABS
5.0000 mg | ORAL_TABLET | ORAL | 0 refills | Status: DC | PRN
Start: 2022-04-05 — End: 2022-09-29

## 2022-04-06 LAB — SURGICAL PATHOLOGY

## 2022-04-10 ENCOUNTER — Telehealth (HOSPITAL_COMMUNITY): Payer: Self-pay | Admitting: *Deleted

## 2022-04-10 NOTE — Telephone Encounter (Signed)
Patient voiced no questions or concerns regarding her own health at this time. EPDS=4. Patient voiced no questions or concerns regarding infant at this time. Patient reports infant sleeps in a bassinet or crib on her back. RN reviewed ABCs of safe sleep. Patient verbalized understanding. Patient requested RN email information on hospital's virtual postpartum classes and support groups. Email sent. Erline Levine, RN, 04/10/22, [phone]

## 2022-04-27 IMAGING — CT CT ABD-PELV W/ CM
2 of 4 series · 17 of 46 positions shown, 19 images · IV contrast (omnipaque)
Comparison: None.

CLINICAL DATA: Epigastric and lower abdominal pain for several
years.

EXAM:
CT ABDOMEN AND PELVIS WITH CONTRAST
TECHNIQUE: Multidetector CT imaging of the abdomen and pelvis was performed
using the standard protocol following bolus administration of
intravenous contrast.
CONTRAST:  80mL OMNIPAQUE IOHEXOL 300 MG/ML  SOLN

[Series 2: abd pelvis 5.00 · axial · 0.69mm/px · z∈[-1473,-1108]mm · 14 of 81 slices shown, 16 images]
[im 4/81  soft-tissue]
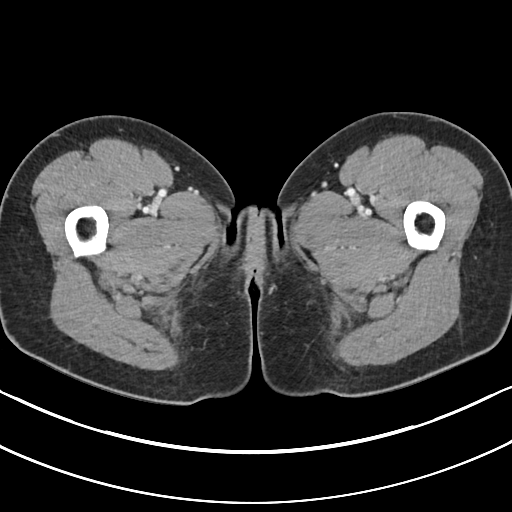
[im 4/81  bone]
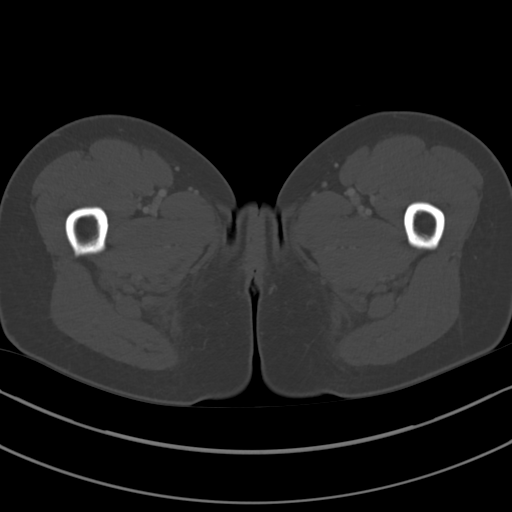
[im 10/81  soft-tissue]
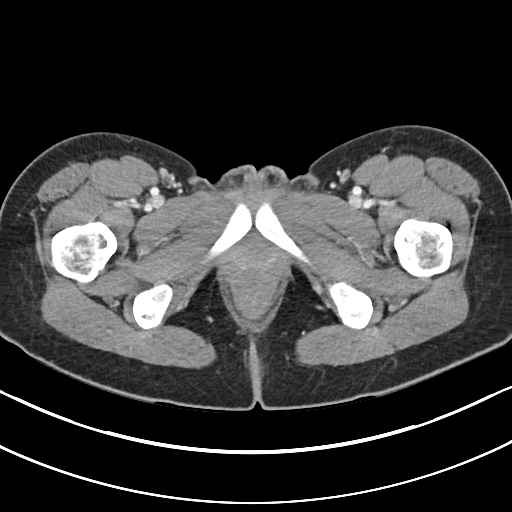
[im 17/81  soft-tissue]
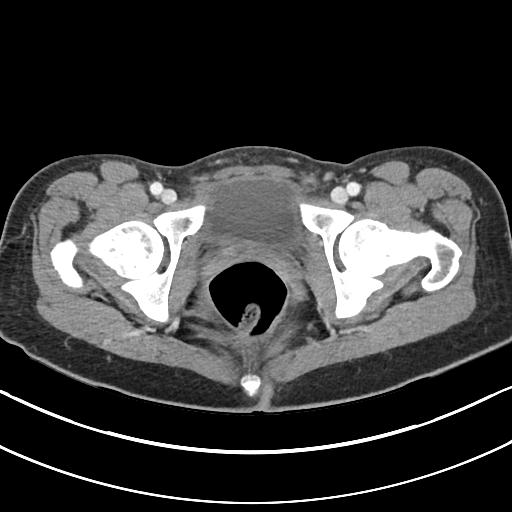
[im 23/81  soft-tissue]
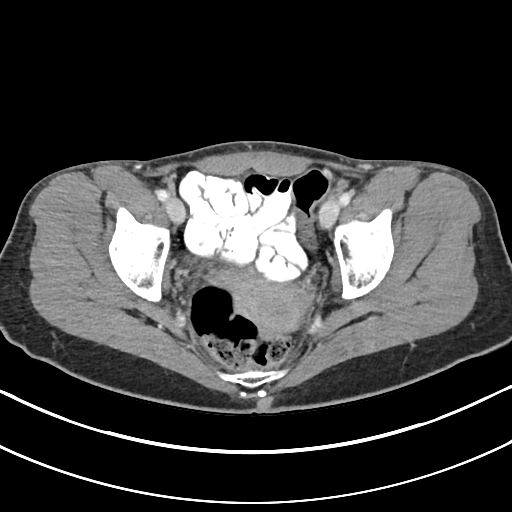
[im 26/81  soft-tissue]
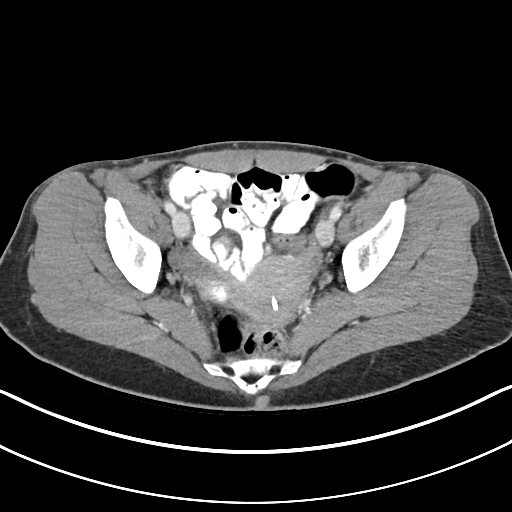
[im 33/81  soft-tissue]
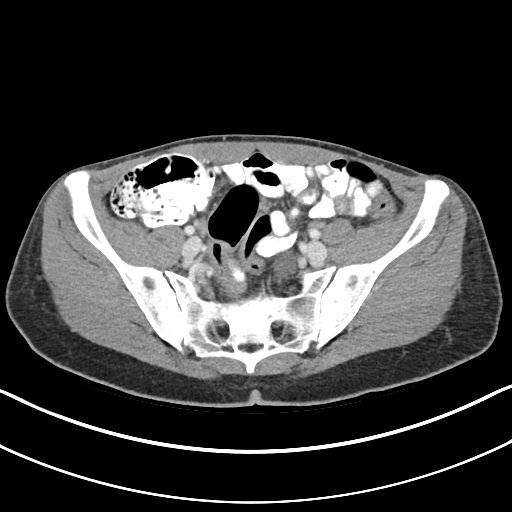
[im 39/81  soft-tissue]
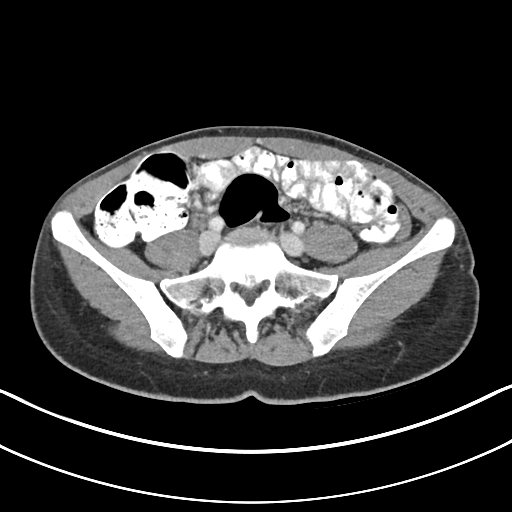
[im 42/81  soft-tissue]
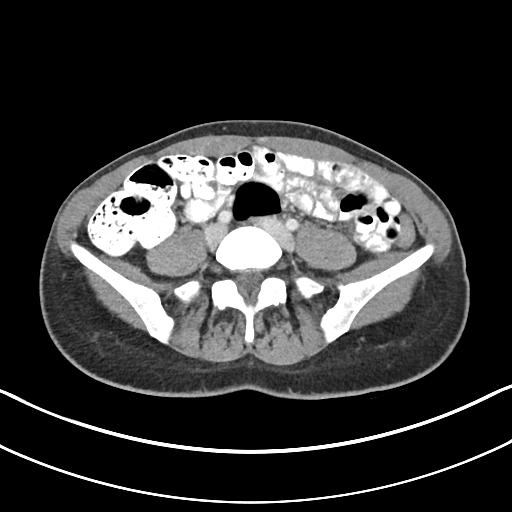
[im 49/81  soft-tissue]
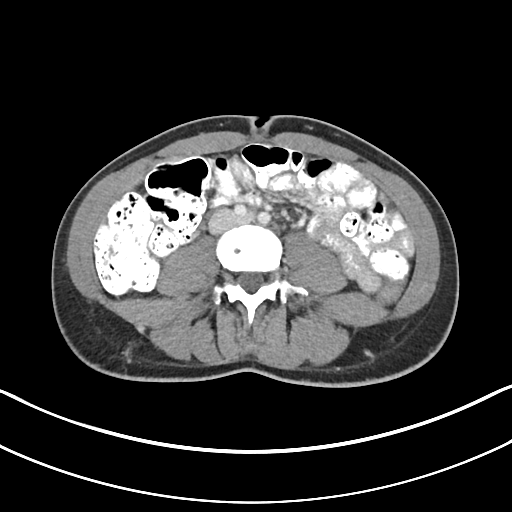
[im 49/81  bone]
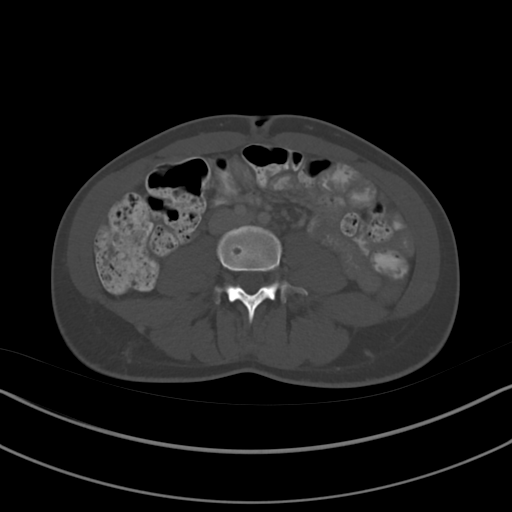
[im 55/81  soft-tissue]
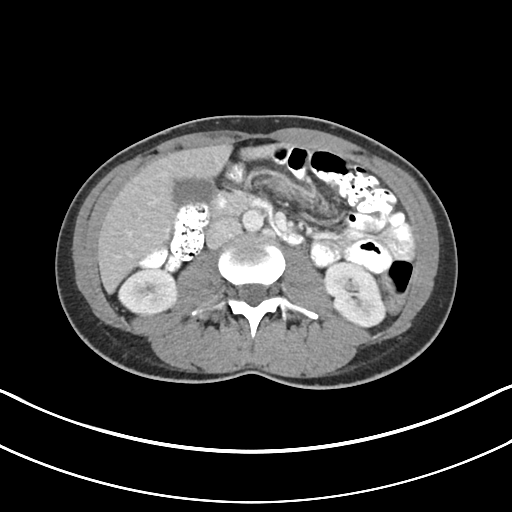
[im 61/81  soft-tissue]
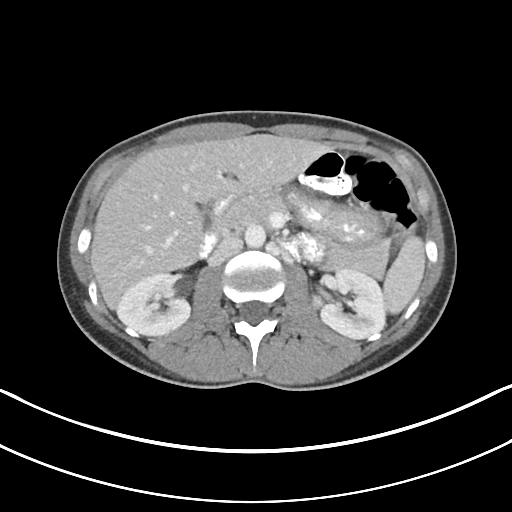
[im 65/81  soft-tissue]
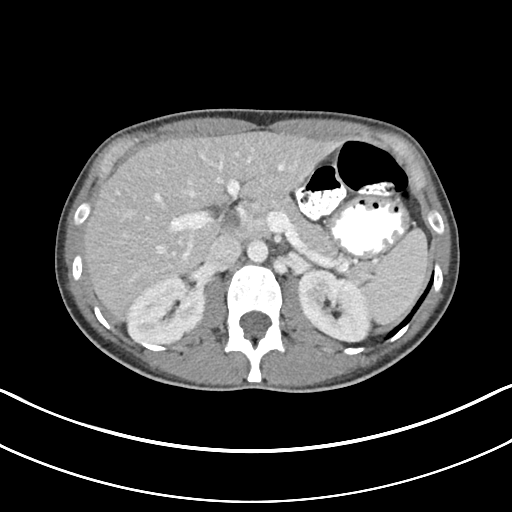
[im 71/81  soft-tissue]
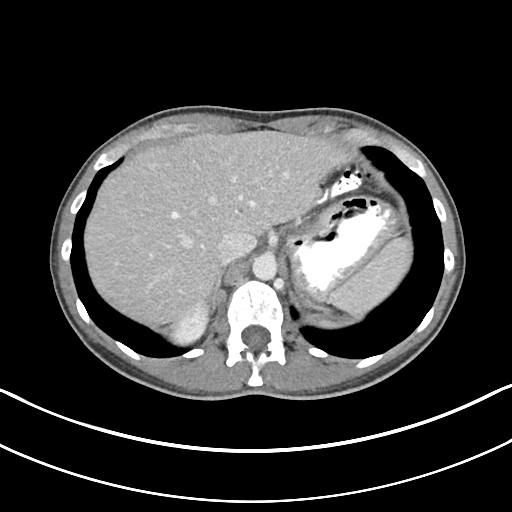
[im 77/81  soft-tissue]
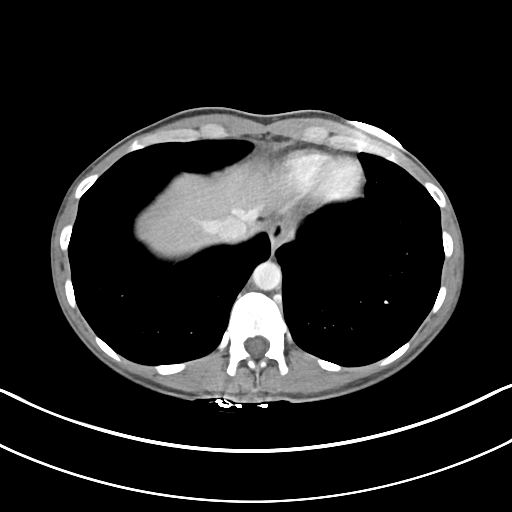

[Series 4: coronals abd pelvis 2.00 cor · coronal · 0.69mm/px · 3 of 107 slices shown]
[im 36/107  soft-tissue]
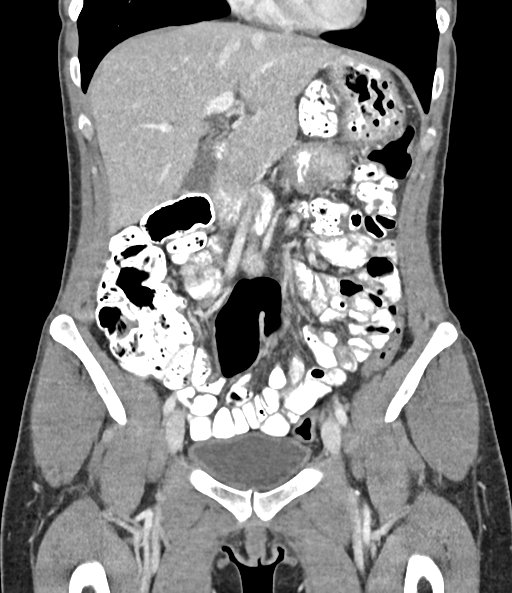
[im 48/107  soft-tissue]
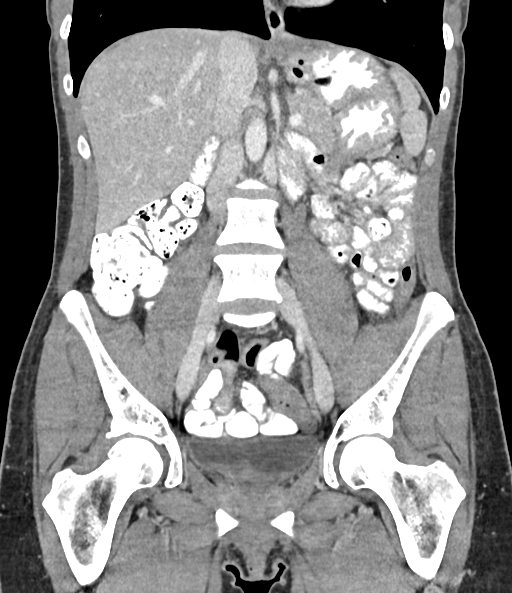
[im 59/107  soft-tissue]
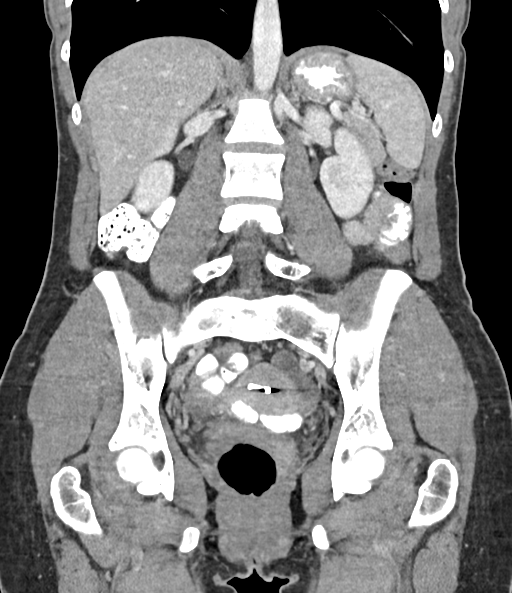

[17 of 46 positions shown; findings below may reference images not displayed]

FINDINGS: Lower Chest: No acute findings.

Hepatobiliary: No hepatic masses identified. Gallbladder is
unremarkable. No evidence of biliary ductal dilatation.

Pancreas:  No mass or inflammatory changes.

Spleen: Within normal limits in size and appearance.

Adrenals/Urinary Tract: No masses identified. No evidence of
ureteral calculi or hydronephrosis.

Stomach/Bowel: No evidence of obstruction, inflammatory process or
abnormal fluid collections.

Vascular/Lymphatic: No pathologically enlarged lymph nodes. No
abdominal aortic aneurysm.

Reproductive: IUD is seen in expected position. No mass or other
significant abnormality.

Other:  None.

Musculoskeletal:  No suspicious bone lesions identified.
IMPRESSION: No acute findings or other significant abnormality.

## 2022-09-22 ENCOUNTER — Other Ambulatory Visit: Payer: Self-pay

## 2022-09-22 ENCOUNTER — Encounter: Payer: Self-pay | Admitting: Family Medicine

## 2022-09-22 DIAGNOSIS — Z13220 Encounter for screening for lipoid disorders: Secondary | ICD-10-CM

## 2022-09-22 DIAGNOSIS — Z1329 Encounter for screening for other suspected endocrine disorder: Secondary | ICD-10-CM

## 2022-09-22 DIAGNOSIS — Z0000 Encounter for general adult medical examination without abnormal findings: Secondary | ICD-10-CM

## 2022-09-28 ENCOUNTER — Other Ambulatory Visit: Payer: 59

## 2022-09-29 ENCOUNTER — Encounter: Payer: Self-pay | Admitting: Family Medicine

## 2022-09-29 ENCOUNTER — Ambulatory Visit (INDEPENDENT_AMBULATORY_CARE_PROVIDER_SITE_OTHER): Payer: 59 | Admitting: Family Medicine

## 2022-09-29 ENCOUNTER — Other Ambulatory Visit: Payer: 59

## 2022-09-29 VITALS — BP 100/60 | HR 68 | Temp 98.6°F | Ht 66.0 in | Wt 148.0 lb

## 2022-09-29 DIAGNOSIS — D649 Anemia, unspecified: Secondary | ICD-10-CM

## 2022-09-29 DIAGNOSIS — F902 Attention-deficit hyperactivity disorder, combined type: Secondary | ICD-10-CM

## 2022-09-29 DIAGNOSIS — Z0000 Encounter for general adult medical examination without abnormal findings: Secondary | ICD-10-CM | POA: Diagnosis not present

## 2022-09-29 DIAGNOSIS — F419 Anxiety disorder, unspecified: Secondary | ICD-10-CM

## 2022-09-29 DIAGNOSIS — N390 Urinary tract infection, site not specified: Secondary | ICD-10-CM | POA: Insufficient documentation

## 2022-09-29 DIAGNOSIS — F32A Depression, unspecified: Secondary | ICD-10-CM

## 2022-09-29 DIAGNOSIS — D229 Melanocytic nevi, unspecified: Secondary | ICD-10-CM

## 2022-09-29 DIAGNOSIS — Z131 Encounter for screening for diabetes mellitus: Secondary | ICD-10-CM | POA: Diagnosis not present

## 2022-09-29 DIAGNOSIS — Z13220 Encounter for screening for lipoid disorders: Secondary | ICD-10-CM | POA: Diagnosis not present

## 2022-09-29 DIAGNOSIS — B001 Herpesviral vesicular dermatitis: Secondary | ICD-10-CM | POA: Insufficient documentation

## 2022-09-29 DIAGNOSIS — Z1329 Encounter for screening for other suspected endocrine disorder: Secondary | ICD-10-CM

## 2022-09-29 DIAGNOSIS — B009 Herpesviral infection, unspecified: Secondary | ICD-10-CM | POA: Insufficient documentation

## 2022-09-29 DIAGNOSIS — Z833 Family history of diabetes mellitus: Secondary | ICD-10-CM | POA: Insufficient documentation

## 2022-09-29 LAB — COMPREHENSIVE METABOLIC PANEL
ALT: 15 U/L (ref 0–35)
AST: 16 U/L (ref 0–37)
Albumin: 4.4 g/dL (ref 3.5–5.2)
Alkaline Phosphatase: 73 U/L (ref 39–117)
BUN: 14 mg/dL (ref 6–23)
CO2: 30 mEq/L (ref 19–32)
Calcium: 9.4 mg/dL (ref 8.4–10.5)
Chloride: 105 mEq/L (ref 96–112)
Creatinine, Ser: 0.88 mg/dL (ref 0.40–1.20)
GFR: 83.37 mL/min (ref >60.00)
Glucose, Bld: 103 mg/dL — ABNORMAL HIGH (ref 70–99)
Potassium: 4.2 mEq/L (ref 3.5–5.1)
Sodium: 140 mEq/L (ref 135–145)
Total Bilirubin: 0.3 mg/dL (ref 0.2–1.2)
Total Protein: 6.5 g/dL (ref 6.0–8.3)

## 2022-09-29 LAB — CBC WITH DIFFERENTIAL/PLATELET
Basophils Absolute: 0 10*3/uL (ref 0.0–0.1)
Basophils Relative: 0.6 % (ref 0.0–3.0)
Eosinophils Absolute: 0.1 10*3/uL (ref 0.0–0.7)
Eosinophils Relative: 2.3 % (ref 0.0–5.0)
HCT: 38.4 % (ref 36.0–46.0)
Hemoglobin: 13.2 g/dL (ref 12.0–15.0)
Lymphocytes Relative: 38.9 % (ref 12.0–46.0)
Lymphs Abs: 1.7 10*3/uL (ref 0.7–4.0)
MCHC: 34.3 g/dL (ref 30.0–36.0)
MCV: 95.3 fl (ref 78.0–100.0)
Monocytes Absolute: 0.2 10*3/uL (ref 0.1–1.0)
Monocytes Relative: 5.3 % (ref 3.0–12.0)
Neutro Abs: 2.3 10*3/uL (ref 1.4–7.7)
Neutrophils Relative %: 52.9 % (ref 43.0–77.0)
Platelets: 231 10*3/uL (ref 150.0–400.0)
RBC: 4.03 Mil/uL (ref 3.87–5.11)
RDW: 13.1 % (ref 11.5–15.5)
WBC: 4.4 10*3/uL (ref 4.0–10.5)

## 2022-09-29 LAB — LIPID PANEL
Cholesterol: 171 mg/dL (ref 0–200)
HDL: 51.8 mg/dL (ref >39.00)
LDL Cholesterol: 106 mg/dL — ABNORMAL HIGH (ref 0–99)
NonHDL: 119.56
Total CHOL/HDL Ratio: 3
Triglycerides: 70 mg/dL (ref 0.0–149.0)
VLDL: 14 mg/dL (ref 0.0–40.0)

## 2022-09-29 LAB — TSH: TSH: 1.11 u[IU]/mL (ref 0.35–5.50)

## 2022-09-29 LAB — HEMOGLOBIN A1C: Hgb A1c MFr Bld: 5.3 % (ref 4.6–6.5)

## 2022-09-29 NOTE — Assessment & Plan Note (Signed)
Continue Wellbutrin 300 mg daily.

## 2022-09-29 NOTE — Progress Notes (Signed)
Tommi Rumps, MD Phone: [phone]  Jacqueline Craig is a 36 y.o. female who presents today for f/u.  ADHD: Patient is interested in going back on medication for this.  Recently she has been taking Adderall XR 20 mg from a leftover prescription.  She notes she is taking it and then waiting 5 days before pumping breastmilk.  She is currently working part-time though may be going back to full-time in the new year.  She is currently breast-feeding though is doing a mixture of breast-feeding and formula.  She notes her GYN noted short acting amphetamine would be okay with her breast-feeding.  Anxiety/depression: Patient notes no anxiety or depression.  She is taking Wellbutrin 300 mg daily.  Skin lesion: Patient reports she had a skin lesion removed from her left arm that was a premelanoma lesion.  She continues to follow with dermatology.  Social History   Tobacco Use  Smoking Status Former  Smokeless Tobacco Never    Current Outpatient Medications on File Prior to Visit  Medication Sig Dispense Refill   acetaminophen (TYLENOL) 325 MG tablet Take 2 tablets (650 mg total) by mouth every 4 (four) hours as needed (for pain scale < 4). 30 tablet 0   ibuprofen (ADVIL) 600 MG tablet Take 1 tablet (600 mg total) by mouth every 6 (six) hours. 30 tablet 0   Prenatal Vit-Fe Fumarate-FA (PRENATAL 1+1 PO) Prenatal     buPROPion (WELLBUTRIN XL) 300 MG 24 hr tablet TAKE 1 TABLET BY MOUTH EVERY DAY (Patient not taking: Reported on 09/29/2022) 90 tablet 3   No current facility-administered medications on file prior to visit.     ROS see history of present illness  Objective  Physical Exam Vitals:   09/29/22 1013  BP: 100/60  Pulse: 68  Temp: 98.6 F (37 C)  SpO2: 99%    BP Readings from Last 3 Encounters:  09/29/22 100/60  04/05/22 106/61  12/17/20 105/70   Wt Readings from Last 3 Encounters:  09/29/22 148 lb (67.1 kg)  04/02/22 182 lb (82.6 kg)  08/19/21 131 lb (59.4 kg)     Physical Exam Constitutional:      General: She is not in acute distress.    Appearance: She is not diaphoretic.  Cardiovascular:     Rate and Rhythm: Normal rate and regular rhythm.     Heart sounds: Normal heart sounds.  Pulmonary:     Effort: Pulmonary effort is normal.     Breath sounds: Normal breath sounds.  Skin:    General: Skin is warm and dry.  Neurological:     Mental Status: She is alert.      Assessment/Plan: Please see individual problem list.  Lipid screening -     Lipid panel -     Comprehensive metabolic panel  Diabetes mellitus screening -     Hemoglobin A1c  Thyroid disorder screen -     TSH  Anemia, unspecified type -     CBC with Differential/Platelet  Attention deficit hyperactivity disorder (ADHD), combined type Assessment & Plan: Discussed the option of going back on medication.  Discussed that the manufacturer does not recommend taking any amphetamines while breast-feeding.  Discussed that there are very few studies looking into amphetamine use in breast-feeding women.  Advised I would check with her OB to get their input on this.  Discussed the safest option if she wanted to go back on amphetamines may be to switch over to formula.   Anxiety and depression Assessment & Plan:  Continue Wellbutrin 300 mg daily.   Atypical nevus Assessment & Plan: This has been removed.  She will continue to see dermatology.     Return in about 3 months (around 12/29/2022) for ADHD.   Tommi Rumps, MD [place]

## 2022-09-29 NOTE — Assessment & Plan Note (Signed)
This has been removed.  She will continue to see dermatology.

## 2022-09-29 NOTE — Assessment & Plan Note (Signed)
Discussed the option of going back on medication.  Discussed that the manufacturer does not recommend taking any amphetamines while breast-feeding.  Discussed that there are very few studies looking into amphetamine use in breast-feeding women.  Advised I would check with her OB to get their input on this.  Discussed the safest option if she wanted to go back on amphetamines may be to switch over to formula.

## 2022-10-08 ENCOUNTER — Encounter: Payer: Self-pay | Admitting: Family Medicine

## 2022-10-11 NOTE — L&D Delivery Note (Signed)
Operative Delivery Note At 3:41 PM a viable female was delivered via Vaginal, Spontaneous.  Presentation: vertex; Position: Occiput,, Anterior; Station: +3. Patient pushed over 2 contractions to easily deliver the female <10 minutes of pushing. The shoulder did not come with gentle downtward traction. Thus the following maneuvers:   Delivery of the head: 09/02/2023  3:40 PM subprapubic pressure, McRoberts,  and then a combination of corkcrew and Posterior arm delivered the shoulders and the rest of the body came easily.  Total shoulder about 1 minute long.     Verbal consent: obtained from patient. APGAR: pending ; weight  pending   Placenta status: , .   Cord:  with the following complications: .  Cord pH: not sent  Anesthesia:  CLE Episiotomy: None Lacerations: None Suture Repair:  None Est. Blood Loss (mL): 202cc   Mom to postpartum.  Baby to Couplet care / Skin to Skin.  [name] 09/02/2023, 4:23 PM

## 2022-10-13 ENCOUNTER — Encounter: Payer: Self-pay | Admitting: Family Medicine

## 2022-10-18 ENCOUNTER — Telehealth: Payer: Self-pay | Admitting: Family Medicine

## 2022-10-18 MED ORDER — AMPHETAMINE-DEXTROAMPHETAMINE 5 MG PO TABS: 5.0000 mg | ORAL_TABLET | Freq: Two times a day (BID) | ORAL | 0 refills | Status: DC

## 2022-10-18 NOTE — Telephone Encounter (Signed)
See other mychart message.

## 2022-10-18 NOTE — Telephone Encounter (Signed)
-----   Message from Tyson Dense, MD sent at 10/15/2022  5:58 PM EST ----- Regarding: RE: Hello! I am responding again so that hopefully it goes directly to you! With short acting in small dosages and infrequent breastfeeding as she has been doing, it is likely fine! ----- Message ----- From: Leone Haven, MD Sent: 09/29/2022  10:36 AM EST To: Tyson Dense, MD  Laurence Aly,   I saw Sion for follow-up today. She is interested in restarting treatment for her ADHD. From what I can gather there is not a lot of research in to amphetamines in breast feeding women though it may be ok to use. I wanted to get your thoughts on this. Thanks.  Randall Hiss

## 2022-10-20 ENCOUNTER — Telehealth: Payer: 59 | Admitting: Family Medicine

## 2022-10-20 NOTE — Telephone Encounter (Signed)
I called the patient and she stated the visit was to discuss the Adderall. Patient stated she wanted to go back on the adderall she stated she did not like the way the titration was on the extended release.  she is no longer breast feeding.  She wanted to cancel so I cancelled the visit for today and she changed the the physical appointment on 11/03/2022 to a follow up on the medication.  Eular Panek,cma

## 2022-10-20 NOTE — Telephone Encounter (Signed)
Patient scheduled a visit for today to discuss medication. Can you call her and see if this is to discuss her adderall? Does she want to try something other than the immediate release adderall? We can do that without a visit today if she is no longer breast feeding. We can also keep the visit if she would prefer.

## 2022-10-21 MED ORDER — AMPHETAMINE-DEXTROAMPHET ER 20 MG PO CP24: 20.0000 mg | ORAL_CAPSULE | ORAL | 0 refills | Status: DC

## 2022-10-21 NOTE — Addendum Note (Signed)
Addended by: Leone Haven on: 10/21/2022 05:06 PM   Modules accepted: Orders

## 2022-11-03 ENCOUNTER — Encounter: Payer: Self-pay | Admitting: Family Medicine

## 2022-11-03 ENCOUNTER — Ambulatory Visit (INDEPENDENT_AMBULATORY_CARE_PROVIDER_SITE_OTHER): Payer: 59 | Admitting: Family Medicine

## 2022-11-03 VITALS — BP 100/60 | HR 90 | Temp 98.6°F | Ht 66.0 in | Wt 145.2 lb

## 2022-11-03 DIAGNOSIS — F902 Attention-deficit hyperactivity disorder, combined type: Secondary | ICD-10-CM | POA: Diagnosis not present

## 2022-11-03 MED ORDER — AMPHETAMINE-DEXTROAMPHET ER 20 MG PO CP24: 20.0000 mg | ORAL_CAPSULE | Freq: Two times a day (BID) | ORAL | 0 refills | Status: DC

## 2022-11-03 NOTE — Progress Notes (Signed)
  Tommi Rumps, MD Phone: [phone]  Jacqueline Craig is a 37 y.o. female who presents today for f/u.  ADHD Medication: adderall Xr 20 mg daily Effectiveness: not enough, in the past she took the 20 XR dose twice daily to get the duration of efficacy she needed Palpitations: no Sleep difficulty: no Appetite suppression: no   Social History   Tobacco Use  Smoking Status Former  Smokeless Tobacco Never    Current Outpatient Medications on File Prior to Visit  Medication Sig Dispense Refill   acetaminophen (TYLENOL) 325 MG tablet Take 2 tablets (650 mg total) by mouth every 4 (four) hours as needed (for pain scale < 4). 30 tablet 0   buPROPion (WELLBUTRIN XL) 300 MG 24 hr tablet TAKE 1 TABLET BY MOUTH EVERY DAY 90 tablet 3   ibuprofen (ADVIL) 600 MG tablet Take 1 tablet (600 mg total) by mouth every 6 (six) hours. 30 tablet 0   Prenatal Vit-Fe Fumarate-FA (PRENATAL 1+1 PO) Prenatal     No current facility-administered medications on file prior to visit.     ROS see history of present illness  Objective  Physical Exam Vitals:   11/03/22 1107  BP: 100/60  Pulse: 90  Temp: 98.6 F (37 C)  SpO2: 99%    BP Readings from Last 3 Encounters:  11/03/22 100/60  09/29/22 100/60  04/05/22 106/61   Wt Readings from Last 3 Encounters:  11/03/22 145 lb 3.2 oz (65.9 kg)  09/29/22 148 lb (67.1 kg)  04/02/22 182 lb (82.6 kg)    Physical Exam Cardiovascular:     Rate and Rhythm: Normal rate and regular rhythm.      Assessment/Plan: Please see individual problem list.  Attention deficit hyperactivity disorder (ADHD), combined type Assessment & Plan: Suboptimally controlled. We will increase her adderal xr to 20 mg BID with the second dose taken late morning.  She will monitor for any sleep issues, appetite suppression, or palpitations with this dose change.  She will send me her blood pressure and pulse in 1 month.  I will see her back in 3 months.  Orders: -      Amphetamine-Dextroamphet ER; Take 1 capsule (20 mg total) by mouth 2 (two) times daily.  Dispense: 60 capsule; Refill: 0    Return in about 3 months (around 02/02/2023).   Tommi Rumps, MD [place]

## 2022-11-03 NOTE — Assessment & Plan Note (Signed)
Suboptimally controlled. We will increase her adderal xr to 20 mg BID with the second dose taken late morning.  She will monitor for any sleep issues, appetite suppression, or palpitations with this dose change.  She will send me her blood pressure and pulse in 1 month.  I will see her back in 3 months.

## 2022-11-29 ENCOUNTER — Encounter: Payer: Self-pay | Admitting: Family Medicine

## 2022-11-29 ENCOUNTER — Other Ambulatory Visit: Payer: Self-pay | Admitting: Family Medicine

## 2022-11-29 DIAGNOSIS — F902 Attention-deficit hyperactivity disorder, combined type: Secondary | ICD-10-CM

## 2022-11-29 MED ORDER — AMPHETAMINE-DEXTROAMPHET ER 20 MG PO CP24: 20.0000 mg | ORAL_CAPSULE | Freq: Two times a day (BID) | ORAL | 0 refills | Status: DC

## 2022-12-27 ENCOUNTER — Other Ambulatory Visit: Payer: Self-pay | Admitting: Family Medicine

## 2022-12-27 DIAGNOSIS — F902 Attention-deficit hyperactivity disorder, combined type: Secondary | ICD-10-CM

## 2022-12-27 MED ORDER — AMPHETAMINE-DEXTROAMPHET ER 20 MG PO CP24: 20.0000 mg | ORAL_CAPSULE | Freq: Two times a day (BID) | ORAL | 0 refills | Status: DC

## 2022-12-29 ENCOUNTER — Ambulatory Visit: Payer: 59 | Admitting: Family Medicine

## 2023-02-02 ENCOUNTER — Ambulatory Visit: Payer: 59 | Admitting: Family Medicine

## 2023-02-09 LAB — OB RESULTS CONSOLE HIV ANTIBODY (ROUTINE TESTING): HIV: NONREACTIVE

## 2023-02-09 LAB — OB RESULTS CONSOLE RPR: RPR: NONREACTIVE

## 2023-02-09 LAB — OB RESULTS CONSOLE HEPATITIS B SURFACE ANTIGEN: Hepatitis B Surface Ag: NEGATIVE

## 2023-02-09 LAB — HEPATITIS C ANTIBODY: HCV Ab: NEGATIVE

## 2023-02-09 LAB — OB RESULTS CONSOLE ANTIBODY SCREEN: Antibody Screen: NEGATIVE

## 2023-02-09 LAB — OB RESULTS CONSOLE RUBELLA ANTIBODY, IGM: Rubella: IMMUNE

## 2023-02-15 LAB — OB RESULTS CONSOLE GC/CHLAMYDIA
Chlamydia: NEGATIVE
Neisseria Gonorrhea: NEGATIVE

## 2023-04-19 ENCOUNTER — Other Ambulatory Visit: Payer: Self-pay | Admitting: Family Medicine

## 2023-06-10 ENCOUNTER — Telehealth: Payer: 59 | Admitting: Family Medicine

## 2023-06-10 DIAGNOSIS — Z3490 Encounter for supervision of normal pregnancy, unspecified, unspecified trimester: Secondary | ICD-10-CM

## 2023-06-10 DIAGNOSIS — J029 Acute pharyngitis, unspecified: Secondary | ICD-10-CM

## 2023-06-10 NOTE — Progress Notes (Signed)
Because [name], I feel your condition warrants further evaluation and I recommend that you be seen in a face to face visit.   NOTE: There will be NO CHARGE for this eVisit   If you are having a true medical emergency please call 911.      For an urgent face to face visit, [facility] has eight urgent care centers for your convenience:   NEW!! [facility] at [facility] Get Driving Directions [phone] [address] [place], [address]    [facility] Health Ur[facility] Care Center at [facility] Get Driving Directions [phone] [address] [place], [place] [address]   [facility] Center [facility]) Get Driving Directions [phone] [address] [place], [place] [address]  [facility] Health Urgent Care Center [facility]) Get Driving Directions [phone] [address] [place],  [place]  [address]  [facility] Health Urgent Care Center [facility] - at [employer]  [phone] [address] [place],  [place] [address]   [facility] Health Urgent Care at [facility] Get Driving Directions [phone] [address] [place], [place] [address]   [facility] Urgent Care at [facility] Get Driving Directions  [phone] [address] [place], [place] [address]FKCL throat   [facility] Directions [phone] [address] [place], [place] [address]  Your MyChart E-visit questionnaire answers were reviewed by a board certified advanced clinical practitioner to complete your personal care plan based on your specific symptoms.  Thank you for using e-Visits.     Pt interested in mono testing. Discussed we care not able to order labs or do referrals and we do not treat during pregnancy. She is a PA at [employer]. She understands and will consider urgent care.  DWB

## 2023-08-05 ENCOUNTER — Telehealth: Payer: Self-pay | Admitting: Family Medicine

## 2023-08-05 NOTE — Telephone Encounter (Signed)
Lvm for pt. We don't do rsv vaccines pt will have to go to her local pharmacy or obgyn

## 2023-08-05 NOTE — Telephone Encounter (Signed)
Pt husband called wanting to get the pt a RSV vaccine. The pt is [redacted] weeks pregnant and she need to get it done before thursday

## 2023-08-09 NOTE — Telephone Encounter (Signed)
Patient is aware and states neither of those will give her the RSV vaccine.

## 2023-08-10 NOTE — Telephone Encounter (Signed)
Patient states she is returning our call.  I read message from Dr. Marikay Alar to patient.  Patient states her OB/GYN does not carry this vaccine.  Patient states her pharmacy states her insurance would bill it as a medical visit.

## 2023-08-10 NOTE — Telephone Encounter (Signed)
Noted. Unfortunately we do not carry the brand of RSV vaccine that is recommended for pregnant patients. Can you find out if she knows why her OB and the pharmacy would not give this to her? She could try checking with the health department.

## 2023-08-10 NOTE — Telephone Encounter (Signed)
Left message for Patient to call the office back regarding the message below about the RSV vaccine.

## 2023-08-23 ENCOUNTER — Encounter (HOSPITAL_COMMUNITY): Payer: Self-pay | Admitting: *Deleted

## 2023-08-23 ENCOUNTER — Telehealth (HOSPITAL_COMMUNITY): Payer: Self-pay | Admitting: *Deleted

## 2023-08-23 NOTE — Telephone Encounter (Signed)
Preadmission screen  

## 2023-08-26 ENCOUNTER — Encounter (HOSPITAL_COMMUNITY): Payer: Self-pay

## 2023-08-29 ENCOUNTER — Telehealth (HOSPITAL_COMMUNITY): Payer: Self-pay | Admitting: *Deleted

## 2023-08-29 LAB — OB RESULTS CONSOLE GBS: GBS: NEGATIVE

## 2023-08-29 NOTE — Telephone Encounter (Signed)
Preadmission screen  

## 2023-08-30 ENCOUNTER — Telehealth (HOSPITAL_COMMUNITY): Payer: Self-pay | Admitting: *Deleted

## 2023-08-30 NOTE — Telephone Encounter (Signed)
Preadmission screen  

## 2023-08-31 ENCOUNTER — Telehealth (HOSPITAL_COMMUNITY): Payer: Self-pay | Admitting: *Deleted

## 2023-08-31 ENCOUNTER — Encounter (HOSPITAL_COMMUNITY): Payer: Self-pay | Admitting: *Deleted

## 2023-08-31 NOTE — Telephone Encounter (Signed)
Preadmission screen  

## 2023-09-01 NOTE — H&P (Signed)
Jacqueline Craig is a 37 y.o. female presenting for scheduled IOL. She has a history of a prior shoulder dystocia that was complicated by both a fractured humerus and Erb's palsy of the neonate. That infant weight 9#11 ounces. This pregnancy has been uncomplicated. Last US showed an EFW= 3349g (7'6oz)= 67%tile at 37.5 weeks. By leopolds, this baby measures 8#4. Expecting a RR boy - "Jacqueline Craig"!!  She has a mood d/o which is c/w buproprion.    OB History     Gravida  2   Para  1   Term  1   Preterm      AB      Living  1      SAB      IAB      Ectopic      Multiple  0   Live Births  1          Past Medical History:  Diagnosis Date   Bilateral bunions    Chickenpox    GERD (gastroesophageal reflux disease)    History of shoulder dystocia in prior pregnancy    Raynaud phenomenon    UTI (lower urinary tract infection)    Past Surgical History:  Procedure Laterality Date   COLONOSCOPY WITH PROPOFOL N/A 06/30/2020   Procedure: COLONOSCOPY WITH PROPOFOL;  Surgeon: Toney Reil, MD;  Location: [facility] ENDOSCOPY;  Service: Gastroenterology;  Laterality: N/A;   Family History: family history includes Alcohol abuse in her father; Diabetes in her maternal grandfather; Glaucoma in her maternal grandfather; Heart disease in her maternal grandfather and maternal grandmother; Hyperlipidemia in her maternal grandfather and maternal grandmother; Hypertension in her maternal grandfather and maternal grandmother; Prostate cancer in her father; Stroke in her maternal grandfather and maternal grandmother. Social History:  reports that she has quit smoking. She has never used smokeless tobacco. She reports current alcohol use of about 7.0 standard drinks of alcohol per week. She reports that she does not use drugs.     Maternal Diabetes: No Genetic Screening: Normal Maternal Ultrasounds/Referrals: Normal Fetal Ultrasounds or other Referrals:  None Maternal Substance Abuse:   No Significant Maternal Medications:  None Significant Maternal Lab Results:  Group B Strep negative Number of Prenatal Visits:greater than 3 verified prenatal visits Maternal Vaccinations:RSV: Given during pregnancy >/=14 days ago, TDap, Flu, and Covid Other Comments:  None  Review of Systems History   Last menstrual period 10/31/2022, currently breastfeeding. Exam Physical Exam  (from office) NAD, A&O NWOB Abd soft, nondistended, gravid  Prenatal labs: ABO, Rh:   Antibody: Negative (05/01 0000) Rubella: Immune (05/01 0000) RPR: Nonreactive (05/01 0000)  HBsAg: Negative (05/01 0000)  HIV: Non-reactive (05/01 0000)  GBS: Negative/-- (11/18 0000)   Assessment/Plan: 37 yo G2P1001 @ 39.1 wga presenting for IOL s/s severe shoulder dystocia in prior pregnancy. She is also AMA. This baby is expected to be significantly smaller by > 1.5 pounds. AC is reassuring as well. She has been counseled regarding her options and understands the risk of recurrence and the possible complications. She desires to proceed vaginally.  Cervix unfavorable. Plan for cytotec followed by pitocin/AROM when more favorable.  GBS negative.     [name] 09/01/2023, 9:58 AM

## 2023-09-02 ENCOUNTER — Encounter (HOSPITAL_COMMUNITY): Payer: Self-pay | Admitting: Obstetrics and Gynecology

## 2023-09-02 ENCOUNTER — Inpatient Hospital Stay (HOSPITAL_COMMUNITY): Payer: Managed Care, Other (non HMO) | Admitting: Anesthesiology

## 2023-09-02 ENCOUNTER — Inpatient Hospital Stay (HOSPITAL_COMMUNITY)
Admission: RE | Admit: 2023-09-02 | Discharge: 2023-09-03 | DRG: 807 | Disposition: A | Discharge status: Home / Self Care | Payer: Managed Care, Other (non HMO) | Attending: Obstetrics and Gynecology | Admitting: Obstetrics and Gynecology

## 2023-09-02 ENCOUNTER — Inpatient Hospital Stay (HOSPITAL_COMMUNITY): Payer: Managed Care, Other (non HMO)

## 2023-09-02 DIAGNOSIS — Z833 Family history of diabetes mellitus: Secondary | ICD-10-CM

## 2023-09-02 DIAGNOSIS — K219 Gastro-esophageal reflux disease without esophagitis: Secondary | ICD-10-CM | POA: Diagnosis present

## 2023-09-02 DIAGNOSIS — Z87891 Personal history of nicotine dependence: Secondary | ICD-10-CM | POA: Diagnosis not present

## 2023-09-02 DIAGNOSIS — Z811 Family history of alcohol abuse and dependence: Secondary | ICD-10-CM | POA: Diagnosis not present

## 2023-09-02 DIAGNOSIS — O9962 Diseases of the digestive system complicating childbirth: Secondary | ICD-10-CM | POA: Diagnosis present

## 2023-09-02 DIAGNOSIS — Z3A39 39 weeks gestation of pregnancy: Secondary | ICD-10-CM

## 2023-09-02 DIAGNOSIS — O3663X0 Maternal care for excessive fetal growth, third trimester, not applicable or unspecified: Principal | ICD-10-CM | POA: Diagnosis present

## 2023-09-02 DIAGNOSIS — Z8249 Family history of ischemic heart disease and other diseases of the circulatory system: Secondary | ICD-10-CM | POA: Diagnosis not present

## 2023-09-02 DIAGNOSIS — O26893 Other specified pregnancy related conditions, third trimester: Secondary | ICD-10-CM | POA: Diagnosis present

## 2023-09-02 DIAGNOSIS — Z3490 Encounter for supervision of normal pregnancy, unspecified, unspecified trimester: Principal | ICD-10-CM

## 2023-09-02 DIAGNOSIS — Z823 Family history of stroke: Secondary | ICD-10-CM

## 2023-09-02 LAB — CBC
HCT: 34.9 % — ABNORMAL LOW (ref 36.0–46.0)
Hemoglobin: 12.3 g/dL (ref 12.0–15.0)
MCH: 34.6 pg — ABNORMAL HIGH (ref 26.0–34.0)
MCHC: 35.2 g/dL (ref 30.0–36.0)
MCV: 98.3 fL (ref 80.0–100.0)
Platelets: 186 10*3/uL (ref 150–400)
RBC: 3.55 MIL/uL — ABNORMAL LOW (ref 3.87–5.11)
RDW: 12.9 % (ref 11.5–15.5)
WBC: 6.7 10*3/uL (ref 4.0–10.5)
nRBC: 0 % (ref 0.0–0.2)

## 2023-09-02 LAB — TYPE AND SCREEN
ABO/RH(D): O POS
Antibody Screen: NEGATIVE

## 2023-09-02 LAB — RPR: RPR Ser Ql: NONREACTIVE

## 2023-09-02 MED ORDER — OXYCODONE-ACETAMINOPHEN 5-325 MG PO TABS: 1.0000 | ORAL_TABLET | ORAL | Status: DC | PRN

## 2023-09-02 MED ORDER — PRENATAL MULTIVITAMIN CH
1.0000 | ORAL_TABLET | Freq: Every day | ORAL | Status: DC
Start: 2023-09-03 — End: 2023-09-03
  Administered 2023-09-03: 1 via ORAL
  Filled 2023-09-02: qty 1

## 2023-09-02 MED ORDER — LIDOCAINE HCL (PF) 1 % IJ SOLN: 30.0000 mL | INTRAMUSCULAR | Status: DC | PRN

## 2023-09-02 MED ORDER — SIMETHICONE 80 MG PO CHEW: 80.0000 mg | CHEWABLE_TABLET | ORAL | Status: DC | PRN

## 2023-09-02 MED ORDER — TERBUTALINE SULFATE 1 MG/ML IJ SOLN: 0.2500 mg | Freq: Once | INTRAMUSCULAR | Status: DC | PRN

## 2023-09-02 MED ORDER — TETANUS-DIPHTH-ACELL PERTUSSIS 5-2.5-18.5 LF-MCG/0.5 IM SUSY
0.5000 mL | PREFILLED_SYRINGE | Freq: Once | INTRAMUSCULAR | Status: DC
Start: 2023-09-03 — End: 2023-09-03

## 2023-09-02 MED ORDER — EPHEDRINE 5 MG/ML INJ: 10.0000 mg | INTRAVENOUS | Status: DC | PRN

## 2023-09-02 MED ORDER — OXYTOCIN-SODIUM CHLORIDE 30-0.9 UT/500ML-% IV SOLN: 2.5000 [IU]/h | INTRAVENOUS | Status: DC

## 2023-09-02 MED ORDER — LIDOCAINE HCL (PF) 1 % IJ SOLN
INTRAMUSCULAR | Status: DC | PRN
  Administered 2023-09-02: 2 mL via EPIDURAL
  Administered 2023-09-02: 8 mL via EPIDURAL

## 2023-09-02 MED ORDER — LACTATED RINGERS IV SOLN: INTRAVENOUS | Status: DC

## 2023-09-02 MED ORDER — OXYCODONE HCL 5 MG PO TABS
5.0000 mg | ORAL_TABLET | ORAL | Status: DC | PRN
  Administered 2023-09-03 (×3): 5 mg via ORAL
  Filled 2023-09-02 (×3): qty 1

## 2023-09-02 MED ORDER — ONDANSETRON HCL 4 MG/2ML IJ SOLN
4.0000 mg | Freq: Four times a day (QID) | INTRAMUSCULAR | Status: DC | PRN
Start: 2023-09-02 — End: 2023-09-02
  Filled 2023-09-02: qty 2

## 2023-09-02 MED ORDER — ONDANSETRON HCL 4 MG/2ML IJ SOLN
4.0000 mg | INTRAMUSCULAR | Status: DC | PRN
  Filled 2023-09-02: qty 2

## 2023-09-02 MED ORDER — MISOPROSTOL 25 MCG QUARTER TABLET
25.0000 ug | ORAL_TABLET | ORAL | Status: DC | PRN
  Administered 2023-09-02: 25 ug via VAGINAL
  Filled 2023-09-02: qty 1

## 2023-09-02 MED ORDER — COCONUT OIL OIL: 1.0000 | TOPICAL_OIL | Status: DC | PRN

## 2023-09-02 MED ORDER — FENTANYL CITRATE (PF) 100 MCG/2ML IJ SOLN: 50.0000 ug | INTRAMUSCULAR | Status: DC | PRN

## 2023-09-02 MED ORDER — WITCH HAZEL-GLYCERIN EX PADS: 1.0000 | MEDICATED_PAD | CUTANEOUS | Status: DC | PRN

## 2023-09-02 MED ORDER — LACTATED RINGERS IV SOLN: 500.0000 mL | INTRAVENOUS | Status: DC | PRN

## 2023-09-02 MED ORDER — BENZOCAINE-MENTHOL 20-0.5 % EX AERO
1.0000 | INHALATION_SPRAY | CUTANEOUS | Status: DC | PRN
  Administered 2023-09-02: 1 via TOPICAL
  Filled 2023-09-02: qty 56

## 2023-09-02 MED ORDER — ACETAMINOPHEN 325 MG PO TABS
650.0000 mg | ORAL_TABLET | ORAL | Status: DC | PRN
  Administered 2023-09-02 – 2023-09-03 (×5): 650 mg via ORAL
  Filled 2023-09-02 (×5): qty 2

## 2023-09-02 MED ORDER — PHENYLEPHRINE 80 MCG/ML (10ML) SYRINGE FOR IV PUSH (FOR BLOOD PRESSURE SUPPORT): 80.0000 ug | PREFILLED_SYRINGE | INTRAVENOUS | Status: DC | PRN

## 2023-09-02 MED ORDER — FENTANYL-BUPIVACAINE-NACL 0.5-0.125-0.9 MG/250ML-% EP SOLN
EPIDURAL | Status: DC | PRN
  Administered 2023-09-02: 12 mL/h via EPIDURAL

## 2023-09-02 MED ORDER — ZOLPIDEM TARTRATE 5 MG PO TABS: 5.0000 mg | ORAL_TABLET | Freq: Every evening | ORAL | Status: DC | PRN

## 2023-09-02 MED ORDER — TRANEXAMIC ACID-NACL 1000-0.7 MG/100ML-% IV SOLN
INTRAVENOUS | Status: AC
  Administered 2023-09-02: 1000 mg via INTRAVENOUS
  Filled 2023-09-02: qty 100

## 2023-09-02 MED ORDER — OXYTOCIN-SODIUM CHLORIDE 30-0.9 UT/500ML-% IV SOLN
1.0000 m[IU]/min | INTRAVENOUS | Status: DC
  Administered 2023-09-02: 2 m[IU]/min via INTRAVENOUS

## 2023-09-02 MED ORDER — OXYTOCIN-SODIUM CHLORIDE 30-0.9 UT/500ML-% IV SOLN
1.0000 m[IU]/min | INTRAVENOUS | Status: DC
  Filled 2023-09-02: qty 500

## 2023-09-02 MED ORDER — OXYCODONE-ACETAMINOPHEN 5-325 MG PO TABS: 2.0000 | ORAL_TABLET | ORAL | Status: DC | PRN

## 2023-09-02 MED ORDER — SOD CITRATE-CITRIC ACID 500-334 MG/5ML PO SOLN: 30.0000 mL | ORAL | Status: DC | PRN

## 2023-09-02 MED ORDER — OXYCODONE HCL 5 MG PO TABS: 10.0000 mg | ORAL_TABLET | ORAL | Status: DC | PRN

## 2023-09-02 MED ORDER — SENNOSIDES-DOCUSATE SODIUM 8.6-50 MG PO TABS
2.0000 | ORAL_TABLET | ORAL | Status: DC
  Administered 2023-09-03: 2 via ORAL
  Filled 2023-09-02: qty 2

## 2023-09-02 MED ORDER — ONDANSETRON HCL 4 MG PO TABS: 4.0000 mg | ORAL_TABLET | ORAL | Status: DC | PRN

## 2023-09-02 MED ORDER — FENTANYL-BUPIVACAINE-NACL 0.5-0.125-0.9 MG/250ML-% EP SOLN
12.0000 mL/h | EPIDURAL | Status: DC | PRN
  Filled 2023-09-02: qty 250

## 2023-09-02 MED ORDER — HYDROXYZINE HCL 50 MG PO TABS
50.0000 mg | ORAL_TABLET | Freq: Four times a day (QID) | ORAL | Status: DC | PRN
Start: 2023-09-02 — End: 2023-09-02

## 2023-09-02 MED ORDER — LACTATED RINGERS IV SOLN
500.0000 mL | Freq: Once | INTRAVENOUS | Status: AC
  Administered 2023-09-02: 500 mL via INTRAVENOUS

## 2023-09-02 MED ORDER — DIPHENHYDRAMINE HCL 25 MG PO CAPS: 25.0000 mg | ORAL_CAPSULE | Freq: Four times a day (QID) | ORAL | Status: DC | PRN

## 2023-09-02 MED ORDER — IBUPROFEN 600 MG PO TABS
600.0000 mg | ORAL_TABLET | Freq: Four times a day (QID) | ORAL | Status: DC
  Administered 2023-09-02 – 2023-09-03 (×4): 600 mg via ORAL
  Filled 2023-09-02 (×5): qty 1

## 2023-09-02 MED ORDER — OXYTOCIN BOLUS FROM INFUSION
333.0000 mL | Freq: Once | INTRAVENOUS | Status: AC
  Administered 2023-09-02: 333 mL via INTRAVENOUS

## 2023-09-02 MED ORDER — ACETAMINOPHEN 325 MG PO TABS
650.0000 mg | ORAL_TABLET | ORAL | Status: DC | PRN
Start: 2023-09-02 — End: 2023-09-02

## 2023-09-02 MED ORDER — DIBUCAINE (PERIANAL) 1 % EX OINT: 1.0000 | TOPICAL_OINTMENT | CUTANEOUS | Status: DC | PRN

## 2023-09-02 MED ORDER — DIPHENHYDRAMINE HCL 50 MG/ML IJ SOLN
12.5000 mg | INTRAMUSCULAR | Status: DC | PRN
Start: 2023-09-02 — End: 2023-09-02

## 2023-09-02 MED ORDER — BUPROPION HCL ER (XL) 150 MG PO TB24
300.0000 mg | ORAL_TABLET | Freq: Every day | ORAL | Status: DC
  Administered 2023-09-02 – 2023-09-03 (×2): 300 mg via ORAL
  Filled 2023-09-02 (×2): qty 1
  Filled 2023-09-02: qty 2

## 2023-09-02 MED ORDER — PHENYLEPHRINE 80 MCG/ML (10ML) SYRINGE FOR IV PUSH (FOR BLOOD PRESSURE SUPPORT)
80.0000 ug | PREFILLED_SYRINGE | INTRAVENOUS | Status: DC | PRN
  Filled 2023-09-02: qty 10

## 2023-09-02 MED ORDER — ZOLPIDEM TARTRATE 5 MG PO TABS
5.0000 mg | ORAL_TABLET | Freq: Every evening | ORAL | Status: DC | PRN
Start: 2023-09-02 — End: 2023-09-02
  Administered 2023-09-02: 5 mg via ORAL
  Filled 2023-09-02: qty 1

## 2023-09-02 MED ORDER — TRANEXAMIC ACID-NACL 1000-0.7 MG/100ML-% IV SOLN: 1000.0000 mg | INTRAVENOUS | Status: AC

## 2023-09-02 NOTE — Progress Notes (Signed)
S/p cytotec x 1  Pitocin at 6  Arom clear fluid  CLE now

## 2023-09-02 NOTE — Lactation Note (Signed)
This note was copied from a baby's chart. Lactation Consultation Note  Patient Name: Jacqueline Craig XNATF'T Date: 09/02/2023 Age:39 hours Reason for consult: Initial assessment;Term (LGA infant greater than 9 lbs at birth.) LC entered the room, MOB had infant latched on her left breast using the cross cradle hold, LC observed infant latch was shallow, MOB mention she felt infant's latch was shallow. LC gave MOB pillow support due to using Bobby pillow to bring infant in alignment and parallel to MOB's breast, when latch was broken , MOB's nipple was pinched. LC assisted with re-latching infant but infant was no longer interested in feeding, infant had BF for 12 minutes. LC discussed ways for MOB to have deeper latch which she will apply tips at next feeding. MOB will continue to BF infant by cues, on demand, every 2-3 hours, skin to skin. LC discussed hand expression and MOB already knows how to hand express. LC discussed maternal rest, balance meals & snack and staying hydrate. MOB knows to call for latch assistance if needed. MOB made aware of O/P services, breastfeeding support groups, community resources, and our phone # for post-discharge questions.    Maternal Data Has patient been taught Hand Expression?: Yes Does the patient have breastfeeding experience prior to this delivery?: Yes How long did the patient breastfeed?: Per MOB, she BF her 23 month old daughter for 6 months  Feeding Mother's Current Feeding Choice: Breast Milk  LATCH Score Latch: Grasps breast easily, tongue down, lips flanged, rhythmical sucking.  Audible Swallowing: A few with stimulation  Type of Nipple: Everted at rest and after stimulation  Comfort (Breast/Nipple): Soft / non-tender  Hold (Positioning): No assistance needed to correctly position infant at breast.  LATCH Score: 9   Lactation Tools Discussed/Used    Interventions Interventions: Breast feeding basics reviewed;Assisted with latch;Skin  to skin;Breast compression;Adjust position;Support pillows;Position options  Discharge Pump: DEBP;Personal  Consult Status Consult Status: Follow-up Date: 09/03/23 Follow-up type: In-patient    Frederico Hamman 09/02/2023, 7:50 PM

## 2023-09-02 NOTE — Anesthesia Procedure Notes (Signed)
Epidural Patient location during procedure: OB Start time: 09/02/2023 8:57 AM End time: 09/02/2023 9:05 AM  Staffing Anesthesiologist: Lannie Fields, DO Performed: anesthesiologist   Preanesthetic Checklist Completed: patient identified, IV checked, risks and benefits discussed, monitors and equipment checked, pre-op evaluation and timeout performed  Epidural Patient position: sitting Prep: DuraPrep and site prepped and draped Patient monitoring: continuous pulse ox, blood pressure, heart rate and cardiac monitor Approach: midline Location: L3-L4 Injection technique: LOR air  Needle:  Needle type: Tuohy  Needle gauge: 17 G Needle length: 9 cm Needle insertion depth: 5 cm Catheter type: closed end flexible Catheter size: 19 Gauge Catheter at skin depth: 10 cm Test dose: negative  Assessment Sensory level: T8 Events: blood not aspirated, no cerebrospinal fluid, injection not painful, no injection resistance, no paresthesia and negative IV test  Additional Notes Patient identified. Risks/Benefits/Options discussed with patient including but not limited to bleeding, infection, nerve damage, paralysis, failed block, incomplete pain control, headache, blood pressure changes, nausea, vomiting, reactions to medication both or allergic, itching and postpartum back pain. Confirmed with bedside nurse the patient's most recent platelet count. Confirmed with patient that they are not currently taking any anticoagulation, have any bleeding history or any family history of bleeding disorders. Patient expressed understanding and wished to proceed. All questions were answered. Sterile technique was used throughout the entire procedure. Please see nursing notes for vital signs. Test dose was given through epidural catheter and negative prior to continuing to dose epidural or start infusion. Warning signs of high block given to the patient including shortness of breath, tingling/numbness in  hands, complete motor block, or any concerning symptoms with instructions to call for help. Patient was given instructions on fall risk and not to get out of bed. All questions and concerns addressed with instructions to call with any issues or inadequate analgesia.  Reason for block:procedure for pain

## 2023-09-02 NOTE — Anesthesia Preprocedure Evaluation (Signed)
Anesthesia Evaluation  Patient identified by MRN, date of birth, ID band Patient awake    Reviewed: Allergy & Precautions, Patient's Chart, lab work & pertinent test results  Airway Mallampati: II  TM Distance: >3 FB Neck ROM: Full    Dental no notable dental hx.    Pulmonary neg pulmonary ROS, former smoker   Pulmonary exam normal breath sounds clear to auscultation       Cardiovascular negative cardio ROS Normal cardiovascular exam Rhythm:Regular Rate:Normal     Neuro/Psych  PSYCHIATRIC DISORDERS Anxiety Depression    negative neurological ROS     GI/Hepatic Neg liver ROS,GERD  ,,  Endo/Other  negative endocrine ROS    Renal/GU negative Renal ROS  negative genitourinary   Musculoskeletal negative musculoskeletal ROS (+)    Abdominal   Peds negative pediatric ROS (+)  Hematology negative hematology ROS (+) Hb 12.3, plt 186   Anesthesia Other Findings   Reproductive/Obstetrics (+) Pregnancy                             Anesthesia Physical Anesthesia Plan  ASA: 2  Anesthesia Plan: Epidural   Post-op Pain Management:    Induction:   PONV Risk Score and Plan: 2  Airway Management Planned: Natural Airway  Additional Equipment: None  Intra-op Plan:   Post-operative Plan:   Informed Consent: I have reviewed the patients History and Physical, chart, labs and discussed the procedure including the risks, benefits and alternatives for the proposed anesthesia with the patient or authorized representative who has indicated his/her understanding and acceptance.       Plan Discussed with:   Anesthesia Plan Comments:        Anesthesia Quick Evaluation

## 2023-09-03 LAB — CBC
HCT: 31.9 % — ABNORMAL LOW (ref 36.0–46.0)
Hemoglobin: 11.1 g/dL — ABNORMAL LOW (ref 12.0–15.0)
MCH: 34.5 pg — ABNORMAL HIGH (ref 26.0–34.0)
MCHC: 34.8 g/dL (ref 30.0–36.0)
MCV: 99.1 fL (ref 80.0–100.0)
Platelets: 173 10*3/uL (ref 150–400)
RBC: 3.22 MIL/uL — ABNORMAL LOW (ref 3.87–5.11)
RDW: 13.1 % (ref 11.5–15.5)
WBC: 10.9 10*3/uL — ABNORMAL HIGH (ref 4.0–10.5)
nRBC: 0 % (ref 0.0–0.2)

## 2023-09-03 MED ORDER — OXYCODONE HCL 5 MG PO TABS: 5.0000 mg | ORAL_TABLET | ORAL | 0 refills | Status: DC | PRN

## 2023-09-03 NOTE — Social Work (Signed)
Jacqueline Craig was referred for history of depression/anxiety.  * Referral screened out by Clinical Social Worker because none of the following criteria appear to apply:  ~ History of anxiety/depression during this pregnancy, or of post-partum depression following prior delivery.  ~ Diagnosis of anxiety and/or depression within last 3 years OR * Jacqueline Craig's symptoms currently being treated with medication and/or therapy. Per chart review Jacqueline Craig is currently taking Wellbutrin 300mg .   Please contact the Clinical Social Worker if needs arise, by [facility] request, or if Jacqueline Craig scores greater than 9/yes to question 10 on [place] Postpartum Depression Screen.  Wende Neighbors, LCSWA Clinical Social Worker [phone]

## 2023-09-03 NOTE — Discharge Summary (Signed)
Postpartum Discharge Summary  Date of Service updated 09/03/23     Patient Name: Jacqueline Craig DOB: [birth year] MRN: [id number]  Date of admission: 09/02/2023 Delivery date:09/02/2023 Delivering provider: [name] Date of discharge: 09/03/2023  Admitting diagnosis: Pregnancy [Z34.90] Intrauterine pregnancy: 102w1d     Secondary diagnosis:  Principal Problem:   Pregnancy  Additional problems: None    Discharge diagnosis: Term Pregnancy Delivered                                              Post partum procedures: None Augmentation: AROM, Pitocin, and Cytotec Complications: None  Hospital course: Induction of Labor With Vaginal Delivery   37 y.o. yo G2P2002 at [redacted]w[redacted]d was admitted to the hospital 09/02/2023 for induction of labor.  Indication for induction:  LGA hx prior shoulder dystocia .  Patient had an labor course complicated by shoulder dystocia with fracture of humerus Membrane Rupture Time/Date: 8:30 AM,09/02/2023  Delivery Method:Vaginal, Spontaneous Operative Delivery:N/A Episiotomy: None Lacerations:  None Details of delivery can be found in separate delivery note.  Patient had a postpartum course complicated by none. Patient is discharged home 09/03/23.  Newborn Data: Birth date:09/02/2023 Birth time:3:41 PM Gender:Female Living status:Living Apgars:7 ,9  Weight:4170 g  Magnesium Sulfate received: No BMZ received: No Rhophylac:N/A MMR:N/A T-DaP:Given prenatally Flu: Yes RSV Vaccine received: Yes Transfusion:No Immunizations administered: Immunization History  Administered Date(s) Administered   DTaP, 5 pertussis antigens 09/20/1984, 10/21/1984, 08/20/1985, 07/29/1986, 05/18/1989, 03/10/2001   Hep A / Hep B 05/12/2015, 06/13/2015   Hepatitis A 05/12/2015   Hepatitis B 05/12/2015   Hepatitis B, ADULT 11/13/2015   Influenza Split 07/13/2022   Influenza-Unspecified 07/27/2012, 07/12/2015, 06/15/2016, 07/12/2017, 08/11/2018, 07/14/2021,  07/25/2021   MMR 08/20/1985, 05/18/1995   Meningococcal Conjugate 08/16/2002   Meningococcal polysaccharide vaccine (MPSV4) 08/16/2002   OPV 09/20/1984, 10/21/1984, 08/20/1985, 07/29/1986, 05/18/1989   PFIZER(Purple Top)SARS-COV-2 Vaccination 10/02/2019, 10/23/2019, 07/25/2020   Td 10/21/1984, 08/20/1985, 07/29/1986, 05/18/1989, 03/10/2001   Tdap 03/31/2012    Physical exam  Vitals:   09/02/23 1824 09/02/23 2013 09/03/23 0100 09/03/23 0425  BP: 127/82 112/79 90/70 95/64   Pulse: 80     Resp: 16 18    Temp: 99 F (37.2 C) 98 F (36.7 C) 98.4 F (36.9 C) 98.2 F (36.8 C)  TempSrc: Oral Oral Oral Oral  SpO2: 98% 98% 97% 97%  Weight:      Height:       General: alert, cooperative, and no distress Lochia: appropriate Uterine Fundus: firm Incision: N/A DVT Evaluation: No evidence of DVT seen on physical exam. Labs: Lab Results  Component Value Date   WBC 10.9 (H) 09/03/2023   HGB 11.1 (L) 09/03/2023   HCT 31.9 (L) 09/03/2023   MCV 99.1 09/03/2023   PLT 173 09/03/2023      Latest Ref Rng & Units 09/29/2022   10:42 AM  CMP  Glucose 70 - 99 mg/dL 478   BUN 6 - 23 mg/dL 14   Creatinine 2.95 - 1.20 mg/dL 6.21   Sodium 308 - 657 mEq/L 140   Potassium 3.5 - 5.1 mEq/L 4.2   Chloride 96 - 112 mEq/L 105   CO2 19 - 32 mEq/L 30   Calcium 8.4 - 10.5 mg/dL 9.4   Total Protein 6.0 - 8.3 g/dL 6.5   Total Bilirubin 0.2 - 1.2 mg/dL 0.3   Alkaline Phos  39 - 117 U/L 73   AST 0 - 37 U/L 16   ALT 0 - 35 U/L 15    Edinburgh Score:    09/02/2023    8:13 PM  Edinburgh Postnatal Depression Scale Screening Tool  I have been able to laugh and see the funny side of things. --      After visit meds:  Allergies as of 09/03/2023   No Known Allergies      Medication List     STOP taking these medications    amphetamine-dextroamphetamine 20 MG 24 hr capsule Commonly known as: ADDERALL XR       TAKE these medications    acetaminophen 325 MG tablet Commonly known as:  Tylenol Take 2 tablets (650 mg total) by mouth every 4 (four) hours as needed (for pain scale < 4).   buPROPion 300 MG 24 hr tablet Commonly known as: WELLBUTRIN XL TAKE 1 TABLET BY MOUTH EVERY DAY   ibuprofen 600 MG tablet Commonly known as: ADVIL Take 1 tablet (600 mg total) by mouth every 6 (six) hours.   PRENATAL 1+1 PO Prenatal         Discharge home in stable condition Infant Feeding: Breast Infant Disposition:home with mother Discharge instruction: per After Visit Summary and Postpartum booklet. Activity: Advance as tolerated. Pelvic rest for 6 weeks.  Diet: routine diet Anticipated Birth Control: Unsure Postpartum Appointment:6 weeks Additional Postpartum F/U:  None Future Appointments:No future appointments. Follow up Visit:      09/03/2023 [name], MD

## 2023-09-03 NOTE — Anesthesia Postprocedure Evaluation (Signed)
Anesthesia Post Note  Patient: Jacqueline Craig  Procedure(s) Performed: AN AD HOC LABOR EPIDURAL     Patient location during evaluation: Mother Baby Anesthesia Type: Epidural Level of consciousness: awake and alert Pain management: pain level controlled Vital Signs Assessment: post-procedure vital signs reviewed and stable Respiratory status: spontaneous breathing, nonlabored ventilation and respiratory function stable Cardiovascular status: stable Postop Assessment: no headache, no backache, epidural receding and able to ambulate Anesthetic complications: no   No notable events documented.  Last Vitals:  Vitals:   09/03/23 0100 09/03/23 0425  BP: 90/70 95/64  Pulse:    Resp:    Temp: 36.9 C 36.8 C  SpO2: 97% 97%    Last Pain:  Vitals:   09/03/23 0720  TempSrc:   PainSc: 2    Pain Goal:                   [name]

## 2023-09-03 NOTE — Lactation Note (Signed)
This note was copied from a baby's chart. Lactation Consultation Note  Patient Name: Jacqueline Craig [id number] Date: 09/03/2023 Age:39 hours Reason for consult: Follow-up assessment;Term  P2, 39 wks, @ 21 hrs of life. Discussed expectations of babies readiness to eat- from sleepy- to feeding cues- to cluster feeding overnights. Mom breast fed with last baby- discussed differences with first days of life and a 57 month old. Infant very tight lipped, Highlighted ways to get baby to open wider, and how to move him onto breast for a deeper latch. Discussed steps of latching, demonstrated hand expression and compression to keep baby working at breast. Baby irritable R/T arm fracture per mom. Highlighted trouble shooting positioning and feeding ideas as baby moves into cluster feeding tonight- mom desires discharge this afternoon possibly. Discussed feeding 8-12x/per day, pumping if needed. Mom elastic nipples. Highlighted LC O/P services available to mom after D/C, milk storage, and pump cleaning.  Maternal Data Has patient been taught Hand Expression?: Yes Does the patient have breastfeeding experience prior to this delivery?: Yes How long did the patient breastfeed?: 6 months  Feeding Mother's Current Feeding Choice: Breast Milk  LATCH Score Latch: Repeated attempts needed to sustain latch, nipple held in mouth throughout feeding, stimulation needed to elicit sucking reflex.  Audible Swallowing: A few with stimulation  Type of Nipple: Everted at rest and after stimulation  Comfort (Breast/Nipple): Soft / non-tender  Hold (Positioning): Assistance needed to correctly position infant at breast and maintain latch.  LATCH Score: 7   Lactation Tools Discussed/Used    Interventions Interventions: Breast feeding basics reviewed;Assisted with latch;Hand express;Breast compression;Education;LC Services brochure;CDC Guidelines for Breast Pump Cleaning (Milk Storage)  Discharge Discharge  Education: Engorgement and breast care  Consult Status Consult Status: Complete Date: 09/03/23    Idamae Lusher 09/03/2023, 2:51 PM

## 2023-09-12 ENCOUNTER — Telehealth (HOSPITAL_COMMUNITY): Payer: Self-pay | Admitting: *Deleted

## 2023-09-12 NOTE — Telephone Encounter (Signed)
09/12/2023  Name: Jacqueline Craig MRN: [id number] DOB: 04/23/1986  Reason for Call:  Transition of Care Hospital Discharge Call  Contact Status: Patient Contact Status: Complete  Language assistant needed: Interpreter Mode: Interpreter Not Needed        Follow-Up Questions: Do You Have Any Concerns About Your Health As You Heal From Delivery?: No Do You Have Any Concerns About Your Infants Health?: Yes What Concerns Do You Have About Your Baby?: Baby had right humeral fracture during delivery and has had some pain associated with the fracture.  Baby is eating and sleeping well.  Edinburgh Postnatal Depression Scale:  In the Past 7 Days: I have been able to laugh and see the funny side of things.: As much as I always could I have looked forward with enjoyment to things.: As much as I ever did I have blamed myself unnecessarily when things went wrong.: Yes, some of the time I have been anxious or worried for no good reason.: No, not at all I have felt scared or panicky for no good reason.: No, not at all Things have been getting on top of me.: No, most of the time I have coped quite well I have been so unhappy that I have had difficulty sleeping.: Not at all I have felt sad or miserable.: No, not at all I have been so unhappy that I have been crying.: Only occasionally The thought of harming myself has occurred to me.: Never Edinburgh Postnatal Depression Scale Total: 4  PHQ2-9 Depression Scale:     Discharge Follow-up: Edinburgh score requires follow up?: No Patient was advised of the following resources:: Support Group, Breastfeeding Support Group  Post-discharge interventions: Reviewed Newborn Safe Sleep Practices  Salena Saner, RN 09/12/2023 16:11

## 2023-12-26 ENCOUNTER — Telehealth: Payer: Self-pay | Admitting: Family Medicine

## 2023-12-26 NOTE — Telephone Encounter (Signed)
 Dr Birdie Sons is no longer at this location. Please call the office to schedule a Transfer of Care to either Dr Charlann Lange, Darleen Crocker or Kara Dies, NP. Purvis Sheffield, please schedule a TOC visit for this patient. [facility]   Thank you

## 2024-01-12 NOTE — Progress Notes (Unsigned)
 New Patient Evaluation and Consultation  Referring Provider: [name], * PCP: No primary care provider on file. Date of Service: 01/13/2024  SUBJECTIVE Chief Complaint: No chief complaint on file.  History of Present Illness: Jacqueline Craig is a 38 y.o. White or Caucasian female seen in consultation at the request of Dr Elon Spanner for evaluation of pelvic organ prolapse.    Underwent PT in 2023 for muscle weakness, diastasis recti, low back pain during 3rd trimester History of injury to L4-L5 and disc herniation of C2-C   ***Review of records significant for: ***alpha 1 antitrypsin deficiency, frequent UTI  Urinary Symptoms: {urine leakage?:24754} Leaks *** time(s) per {days/wks/mos/yrs:310907}.  Pad use: {NUMBERS 1-10:18281} {pad option:24752} per day.   Patient {ACTION; IS/IS ZOX:09604540} bothered by UI symptoms.  Day time voids ***.  Nocturia: *** times per night to void. Voiding dysfunction:  {empties:24755} bladder well.  Patient {DOES NOT does:27190::"does not"} use a catheter to empty bladder.  When urinating, patient feels {urine symptoms:24756} Drinks: *** per day  UTIs: {NUMBERS 1-10:18281} UTI's in the last year.   {ACTIONS;DENIES/REPORTS:21021675::"Denies"} history of {urologic concerns:24757} No results found for the last 90 days.   Pelvic Organ Prolapse Symptoms:                  Patient {denies/ admits to:24761} a feeling of a bulge the vaginal area. It has been present for {NUMBER 1-10:22536} {days/wks/mos/yrs:310907}.  Patient {denies/ admits to:24761} seeing a bulge.  This bulge {ACTION; IS/IS JWJ:19147829} bothersome.  Bowel Symptom: Bowel movements: *** time(s) per {Time; day/week/month:13537} Stool consistency: {stool consistency:24758} Straining: {yes/no:19897}.  Splinting: {yes/no:19897}.  Incomplete evacuation: {yes/no:19897}.  Patient {denies/ admits to:24761} accidental bowel leakage / fecal incontinence  Occurs: *** time(s) per  {Time; day/week/month:13537}  Consistency with leakage: {stool consistency:24758} Bowel regimen: {bowel regimen:24759} Last colonoscopy: Date ***, Results *** [facility] Colonoscopy   This patient has no relevant Health Maintenance data.     Sexual Function Sexually active: {yes/no:19897}.  Sexual orientation: {Sexual Orientation:[phone]} Pain with sex: {pain with sex:24762}  Pelvic Pain {denies/ admits to:24761} pelvic pain Location: *** Pain occurs: *** Prior pain treatment: *** Improved by: *** Worsened by: ***   Past Medical History:  Past Medical History:  Diagnosis Date   Bilateral bunions    Chickenpox    GERD (gastroesophageal reflux disease)    History of shoulder dystocia in prior pregnancy    Raynaud phenomenon    UTI (lower urinary tract infection)      Past Surgical History:   Past Surgical History:  Procedure Laterality Date   COLONOSCOPY WITH PROPOFOL N/A 06/30/2020   Procedure: COLONOSCOPY WITH PROPOFOL;  Surgeon: Toney Reil, MD;  Location: [facility] ENDOSCOPY;  Service: Gastroenterology;  Laterality: N/A;     Past OB/GYN History: OB History  Gravida Para Term Preterm AB Living  2 2 2   2   SAB IAB Ectopic Multiple Live Births     0 2    # Outcome Date GA Lbr Len/2nd Weight Sex Type Anes PTL Lv  2 Term 09/02/23 [redacted]w[redacted]d / 00:15 9 lb 3.1 oz (4.17 kg) M Vag-Spont EPI  LIV     Birth Comments: broken right humerus, darker pigmented left nipple  1 Term 04/02/22 [redacted]w[redacted]d / 01:59 9 lb 4.5 oz (4.21 kg) F Vag-Vacuum EPI  LIV    Vaginal deliveries: ***,  Forceps/ Vacuum deliveries: ***, Cesarean section: *** Menopausal: {menopausal:24763} Contraception: ***. Last pap smear was ***.  Any history of abnormal pap smears: {yes/no:19897}. No results found for: "  DIAGPAP", "HPVHIGH", "ADEQPAP"  Medications: Patient has a current medication list which includes the following prescription(s): acetaminophen, bupropion, ibuprofen, oxycodone, and prenatal vit-fe  fumarate-fa.   Allergies: Patient has no known allergies.   Social History:  Social History   Tobacco Use   Smoking status: Former   Smokeless tobacco: Never  Advertising account planner   Vaping status: Never Used  Substance Use Topics   Alcohol use: Yes    Alcohol/week: 7.0 standard drinks of alcohol    Types: 7 Standard drinks or equivalent per week   Drug use: No    Relationship status: {relationship status:24764} Patient lives with ***.   Patient {ACTION; IS/IS UJW:11914782} employed ***. Regular exercise: {Yes/No:304960894} History of abuse: {Yes/No:304960894}  Family History:   Family History  Problem Relation Age of Onset   Alcohol abuse Father    Prostate cancer Father    Stroke Maternal Grandmother    Heart disease Maternal Grandmother    Hypertension Maternal Grandmother    Hyperlipidemia Maternal Grandmother    Stroke Maternal Grandfather    Diabetes Maternal Grandfather    Heart disease Maternal Grandfather    Hypertension Maternal Grandfather    Hyperlipidemia Maternal Grandfather    Glaucoma Maternal Grandfather      Review of Systems: ROS   OBJECTIVE Physical Exam: There were no vitals filed for this visit.  Physical Exam   GU / Detailed Urogynecologic Evaluation:  Pelvic Exam: Normal external female genitalia; Bartholin's and Skene's glands normal in appearance; urethral meatus normal in appearance, no urethral masses or discharge.   CST: {gen negative/positive:315881}  Reflexes: bulbocavernosis {DESC; PRESENT/NOT PRESENT:21021351}, anocutaneous {DESC; PRESENT/NOT PRESENT:21021351} ***bilaterally.  Speculum exam reveals normal vaginal mucosa {With/Without:20273} atrophy. Cervix {exam; gyn cervix:30847}. Uterus {exam; pelvic uterus:30849}. Adnexa {exam; adnexa:12223}.    s/p hysterectomy: Speculum exam reveals normal vaginal mucosa {With/Without:20273}  atrophy and normal vaginal cuff.  Adnexa {exam; adnexa:12223}.    With apex supported, anterior  compartment defect was {reduced:24765}  Pelvic floor strength {Roman # I-V:19040}/V, puborectalis {Roman # I-V:19040}/V external anal sphincter {Roman # I-V:19040}/V  Pelvic floor musculature: Right levator {Tender/Non-tender:20250}, Right obturator {Tender/Non-tender:20250}, Left levator {Tender/Non-tender:20250}, Left obturator {Tender/Non-tender:20250}  POP-Q:   POP-Q                                               Aa                                               Ba                                                 C                                                Gh  Pb                                               tvl                                                Ap                                               Bp                                                 D      Rectal Exam:  Normal sphincter tone, {rectocele:24766} distal rectocele, enterocoele {DESC; PRESENT/NOT PRESENT:21021351}, no rectal masses, {sign of:24767} dyssynergia when asking the patient to bear down.  Post-Void Residual (PVR) by Bladder Scan: In order to evaluate bladder emptying, we discussed obtaining a postvoid residual and patient agreed to this procedure.  Procedure: The ultrasound unit was placed on the patient's abdomen in the suprapubic region after the patient had voided.      Laboratory Results: Lab Results  Component Value Date   BILIRUBINUR NEGATIVE 03/07/2019   PROTEINUR NEGATIVE 03/07/2019   LEUKOCYTESUR NEGATIVE 03/07/2019    Lab Results  Component Value Date   CREATININE 0.88 09/29/2022   CREATININE 0.88 08/15/2020   CREATININE 0.86 01/01/2019    Lab Results  Component Value Date   HGBA1C 5.3 09/29/2022    Lab Results  Component Value Date   HGB 11.1 (L) 09/03/2023     ASSESSMENT AND PLAN Ms. Yamin is a 38 y.o. with: No diagnosis found.  There are no diagnoses linked to this encounter.   Loleta Chance,  MD

## 2024-01-13 ENCOUNTER — Encounter: Payer: Self-pay | Admitting: Obstetrics

## 2024-01-13 ENCOUNTER — Ambulatory Visit: Admitting: Obstetrics

## 2024-01-13 VITALS — BP 113/75 | HR 79 | Ht 66.14 in | Wt 147.0 lb

## 2024-01-13 DIAGNOSIS — N8110 Cystocele, unspecified: Secondary | ICD-10-CM

## 2024-01-13 DIAGNOSIS — N912 Amenorrhea, unspecified: Secondary | ICD-10-CM | POA: Diagnosis not present

## 2024-01-13 DIAGNOSIS — K5900 Constipation, unspecified: Secondary | ICD-10-CM

## 2024-01-13 DIAGNOSIS — N393 Stress incontinence (female) (male): Secondary | ICD-10-CM

## 2024-01-13 DIAGNOSIS — N941 Unspecified dyspareunia: Secondary | ICD-10-CM | POA: Diagnosis not present

## 2024-01-13 DIAGNOSIS — R350 Frequency of micturition: Secondary | ICD-10-CM

## 2024-01-13 LAB — POCT URINALYSIS DIPSTICK
Bilirubin, UA: NEGATIVE
Blood, UA: NEGATIVE
Glucose, UA: NEGATIVE
Ketones, UA: NEGATIVE
Leukocytes, UA: NEGATIVE
Nitrite, UA: NEGATIVE
Protein, UA: NEGATIVE
Spec Grav, UA: 1.02 (ref 1.010–1.025)
Urobilinogen, UA: 0.2 U/dL
pH, UA: 6 (ref 5.0–8.0)

## 2024-01-13 MED ORDER — ESTRADIOL 0.1 MG/GM VA CREA: TOPICAL_CREAM | VAGINAL | 3 refills | Status: AC

## 2024-01-13 MED ORDER — LIDOCAINE 5 % EX OINT: TOPICAL_OINTMENT | CUTANEOUS | 0 refills | Status: AC

## 2024-01-13 NOTE — Assessment & Plan Note (Addendum)
-   History of L4-L5 injury and C2-3 disc herniation in 2001, reports R sided sciatica with tingling and numbness after injury that improved after steroid injections - underwent pelvic floor PT x 1 in 2017 prior to pregnancy due to high tone pelvic floor tone and painful intercourse. Pain improved after 1st delivery. - reproducible bilateral pelvic floor myofascial pain and SI joint pain on exam - The origin of pelvic floor muscle spasm can be multifactorial, including primary, reactive to a different pain source, trauma, or even part of a centralized pain syndrome.Treatment options include pelvic floor physical therapy, local (vaginal) or oral  muscle relaxants, pelvic muscle trigger point injections or centrally acting pain medications.   - discussed nerve injury that can occur during vaginal delivery with increased risk due to failed OVD and LGA infant with shoulder dystocia - encouraged vaginal moisturizers, Vitamin E suppository, lubrication use for intercourse - pending pelvic floor PT 04/2024 - avoid Kegels and encouraged pelvic floor relaxation exercises - topical lidocaine use PRN prior to intercourse

## 2024-01-13 NOTE — Assessment & Plan Note (Signed)
-   POCT UA negative, PVR 26mL - required diapers for workouts after 1st pregnancy, improved during 2nd pregnancy - currently breastfeeding - symptoms resolved at onset of symptomatic vaginal bulge, discussed need to assess for occult SUI with treatment of pelvic organ prolapse - For treatment of stress urinary incontinence,  non-surgical options include expectant management, weight loss, physical therapy, as well as a pessary.  Surgical options include a midurethral sling, Burch urethropexy, and transurethral injection of a bulking agent. - pending pelvic floor PT - discussed possible need for incontinence pessary

## 2024-01-13 NOTE — Assessment & Plan Note (Signed)
-   discussed transient hypoestrogenic state during breastfeeding - encouraged low dose topical vaginal estrogen to reassess symptoms

## 2024-01-13 NOTE — Patient Instructions (Addendum)
 You have a stage 2 or 3 (out of 4) prolapse.  We discussed the fact that it is not life threatening but there are several treatment options. For treatment of pelvic organ prolapse, we discussed options for management including expectant management, conservative management, and surgical management, such as Kegels, a pessary, pelvic floor physical therapy, and specific surgical procedures.     Constipation: Our goal is to achieve formed bowel movements daily or every-other-day.  You may need to try different combinations of the following options to find what works best for you - everybody's body works differently so feel free to adjust the dosages as needed.  Some options to help maintain bowel health include:  Dietary changes (more leafy greens, vegetables and fruits; less processed foods) Fiber supplementation (Benefiber, FiberCon, Metamucil or Psyllium). Start slow and increase gradually to full dose. Over-the-counter agents such as: stool softeners (Docusate or Colace) and/or laxatives (Miralax, milk of magnesia)  "Power Pudding" is a natural mixture that may help your constipation.  To make blend 1 cup applesauce, 1 cup wheat bran, and 3/4 cup prune juice, refrigerate and then take 1 tablespoon daily with a large glass of water as needed.   Women should try to eat at least 21 to 25 grams of fiber a day, while men should aim for 30 to 38 grams a day. You can add fiber to your diet with food or a fiber supplement such as psyllium (metamucil), benefiber, or fibercon.   Here's a look at how much dietary fiber is found in some common foods. When buying packaged foods, check the Nutrition Facts label for fiber content. It can vary among brands.  Fruits Serving size Total fiber (grams)*  Raspberries 1 cup 8.0  Pear 1 medium 5.5  Apple, with skin 1 medium 4.5  Banana 1 medium 3.0  Orange 1 medium 3.0  Strawberries 1 cup 3.0   Vegetables Serving size Total fiber (grams)*  Green peas, boiled 1 cup  9.0  Broccoli, boiled 1 cup chopped 5.0  Turnip greens, boiled 1 cup 5.0  Brussels sprouts, boiled 1 cup 4.0  Potato, with skin, baked 1 medium 4.0  Sweet corn, boiled 1 cup 3.5  Cauliflower, raw 1 cup chopped 2.0  Carrot, raw 1 medium 1.5   Grains Serving size Total fiber (grams)*  Spaghetti, whole-wheat, cooked 1 cup 6.0  Barley, pearled, cooked 1 cup 6.0  Bran flakes 3/4 cup 5.5  Quinoa, cooked 1 cup 5.0  Oat bran muffin 1 medium 5.0  Oatmeal, instant, cooked 1 cup 5.0  Popcorn, air-popped 3 cups 3.5  Brown rice, cooked 1 cup 3.5  Bread, whole-wheat 1 slice 2.0  Bread, rye 1 slice 2.0   Legumes, nuts and seeds Serving size Total fiber (grams)*  Split peas, boiled 1 cup 16.0  Lentils, boiled 1 cup 15.5  Black beans, boiled 1 cup 15.0  Baked beans, canned 1 cup 10.0  Chia seeds 1 ounce 10.0  Almonds 1 ounce (23 nuts) 3.5  Pistachios 1 ounce (49 nuts) 3.0  Sunflower kernels 1 ounce 3.0  *Rounded to nearest 0.5 gram. Source: [employer] for Standard Reference, Legacy Release    The origin of pelvic floor muscle spasm can be multifactorial, including primary, reactive to a different pain source, trauma, or even part of a centralized pain syndrome.Treatment options include pelvic floor physical therapy, local (vaginal) or oral  muscle relaxants, pelvic muscle trigger point injections or centrally acting pain medications.     Start topical  lidocaine 10  min prior to intercourse for pain.  Continue position changes and use lubrication.   If no relief, trial of vaginal estrogen.  For vaginal atrophy (thinning of the vaginal tissue that can cause dryness and burning) and UTI prevention we discussed estrogen replacement in the form of vaginal cream.   Start vaginal estrogen therapy nightly for two weeks then 2 times weekly at night. This can be placed with your finger or an applicator inside the vagina and around the urethra.  Please let us know if the  prescription is too expensive and we can look for alternative options.   Is vaginal estrogen therapy safe for me? Vaginal estrogen preparations act on the vaginal skin, and only a very tiny amount is absorbed into the bloodstream (0.01%).  They work in a similar way to hand or face cream.  There is minimal absorption and they are therefore perfectly safe. If you have had breast cancer and have persistent troublesome symptoms which aren't settling with vaginal moisturisers and lubricants, local estrogen treatment may be a possibility, but consultation with your oncologist should take place first.   We discussed the symptoms of overactive bladder (OAB), which include urinary urgency, urinary frequency, night-time urination, with or without urge incontinence.  We discussed management including behavioral therapy (decreasing bladder irritants by following a bladder diet, urge suppression strategies, timed voids, bladder retraining), physical therapy, medication; and for refractory cases posterior tibial nerve stimulation, sacral neuromodulation, and intravesical botulinum toxin injection.   Reduce caffeine use to 200mg /day

## 2024-01-13 NOTE — Assessment & Plan Note (Addendum)
-   [place] I-II and intermittent III stools with sensation of incomplete emptying - using stool softener and fiber supplementation and squatting position for bowel movements - For constipation, we reviewed the importance of a better bowel regimen.  We also discussed the importance of avoiding chronic straining, as it can exacerbate her pelvic floor symptoms; we discussed treating constipation and straining prior to surgery, as postoperative straining can lead to damage to the repair and recurrence of symptoms. We discussed initiating therapy with increasing fluid intake, fiber supplementation, stool softeners, and laxatives such as miralax.  - discontinue stool softener - encouraged titration of miralax and avoid straining

## 2024-01-13 NOTE — Assessment & Plan Note (Addendum)
-   symptoms and prior exam consistent with stage III prolapse - no rectal prolapse visualized on exam, repeat at follow-up - Impressa intermittently expulses with relief of bulge - self directed pelvic floor exercises with Perifit, discussed caution to continue use without pelvic floor relaxation if it worsens dyspareunia  - For treatment of pelvic organ prolapse, we discussed options for management including expectant management, conservative management, and surgical management, such as Kegels, a pessary, pelvic floor physical therapy, and specific surgical procedures. - pending pelvic floor PT referral - desires to return for pessary fitting, discussed risk of return of SUI and need for incontinence pessary - encouraged to consider desire for future fertility prior to pursuing surgical intervention of pelvic organ prolapse.

## 2024-01-25 ENCOUNTER — Ambulatory Visit: Admitting: Obstetrics and Gynecology

## 2024-02-21 ENCOUNTER — Ambulatory Visit: Admitting: Obstetrics and Gynecology

## 2024-02-29 ENCOUNTER — Ambulatory Visit (INDEPENDENT_AMBULATORY_CARE_PROVIDER_SITE_OTHER): Admitting: Obstetrics and Gynecology

## 2024-02-29 ENCOUNTER — Encounter: Payer: Self-pay | Admitting: Obstetrics and Gynecology

## 2024-02-29 VITALS — BP 120/79 | HR 76

## 2024-02-29 DIAGNOSIS — N393 Stress incontinence (female) (male): Secondary | ICD-10-CM

## 2024-02-29 DIAGNOSIS — N8110 Cystocele, unspecified: Secondary | ICD-10-CM

## 2024-02-29 DIAGNOSIS — N813 Complete uterovaginal prolapse: Secondary | ICD-10-CM | POA: Diagnosis not present

## 2024-02-29 NOTE — Patient Instructions (Signed)
 Consider using a good clean love or some lubrication with hylauronic acid or vitamin e to support the vagina while using the pessary and breast feeding.

## 2024-02-29 NOTE — Progress Notes (Signed)
 [facility]   Subjective:     Chief Complaint: Pessary fitting Jacqueline Craig is a 38 y.o. female is here for pessary fitting.)  History of Present Illness: Jacqueline Craig is a 38 y.o. female with stage III pelvic organ prolapse and stress incontinence who presents today for a pessary fitting.    Past Medical History: Patient  has a past medical history of Bilateral bunions, Chickenpox, GERD (gastroesophageal reflux disease), History of shoulder dystocia in prior pregnancy, Raynaud phenomenon, and UTI (lower urinary tract infection).   Past Surgical History: She  has a past surgical history that includes Colonoscopy with propofol  (N/A, 06/30/2020).   Medications: She has a current medication list which includes the following prescription(s): bupropion , ibuprofen , lidocaine , prenatal vit-fe fumarate-fa, and estradiol .   Allergies: Patient has no known allergies.   Social History: Patient  reports that she has quit smoking. She has never used smokeless tobacco. She reports current alcohol use of about 7.0 standard drinks of alcohol per week. She reports that she does not use drugs.      Objective:     BP 120/79   Pulse 76   Previous pop Q POP-Q:    POP-Q   0                                            Aa   0                                           Ba   -5                                              C    5                                            Gh   3                                            Pb   9                                            tvl    0                                            Ap   0                                            Bp   -6  D   Gen: No apparent distress, A&O x 3. Pelvic Exam: Normal external female genitalia; Bartholin's and Skene's glands normal in appearance; urethral meatus normal in appearance, no urethral masses or discharge.   A size #5 cube pessary was  fitted. It was comfortable, stayed in place with valsalva and was an appropriate size on examination, with one finger fitting between the pessary and the vaginal walls. Patient was able to urinate and remove and replace pessary as needed.   Assessment/Plan:    Assessment: Jacqueline Craig is a 38 y.o. with stage III pelvic organ prolapse who presents for a pessary fitting. Plan: She was fitted with a #5 cube pessary. She will remove nightly. She will use lubricant.   Follow-up in 3 months for a pessary check or sooner as needed. 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 I encouraged her to call or message with concerns. She reports she will. She is considering trying to have another child and is in discussions with this with her husband. She reports they will most likely have made a decision by the fall on whether or not to try for another child or if she should pursue surgery.   Patient to follow up with Dr. Aron Lard for further discussion.    Emile Ringgenberg G Shanica Castellanos, NP

## 2024-03-02 ENCOUNTER — Other Ambulatory Visit: Payer: Self-pay | Admitting: Adult Health

## 2024-03-02 MED ORDER — ALBUTEROL SULFATE HFA 108 (90 BASE) MCG/ACT IN AERS: 2.0000 | INHALATION_SPRAY | Freq: Four times a day (QID) | RESPIRATORY_TRACT | 0 refills | Status: AC | PRN

## 2024-03-02 MED ORDER — AZITHROMYCIN 250 MG PO TABS: ORAL_TABLET | ORAL | 0 refills | Status: AC

## 2024-03-13 DIAGNOSIS — Z3483 Encounter for supervision of other normal pregnancy, third trimester: Secondary | ICD-10-CM | POA: Diagnosis not present

## 2024-03-13 DIAGNOSIS — Z3482 Encounter for supervision of other normal pregnancy, second trimester: Secondary | ICD-10-CM | POA: Diagnosis not present

## 2024-03-20 ENCOUNTER — Ambulatory Visit: Admitting: Obstetrics

## 2024-05-02 ENCOUNTER — Ambulatory Visit: Payer: Self-pay | Admitting: Physical Therapy

## 2024-05-08 ENCOUNTER — Ambulatory Visit: Admitting: Physical Therapy

## 2024-05-15 ENCOUNTER — Ambulatory Visit: Payer: Self-pay | Admitting: Physical Therapy

## 2024-05-22 ENCOUNTER — Ambulatory Visit: Admitting: Physical Therapy

## 2024-05-29 ENCOUNTER — Ambulatory Visit: Admitting: Physical Therapy

## 2024-06-05 ENCOUNTER — Ambulatory Visit: Admitting: Physical Therapy

## 2024-06-12 ENCOUNTER — Ambulatory Visit: Admitting: Physical Therapy

## 2024-06-19 ENCOUNTER — Ambulatory Visit: Admitting: Physical Therapy

## 2024-06-25 ENCOUNTER — Encounter: Payer: Self-pay | Admitting: Obstetrics

## 2024-06-26 ENCOUNTER — Ambulatory Visit: Admitting: Physical Therapy

## 2024-07-02 ENCOUNTER — Ambulatory Visit: Admitting: Obstetrics

## 2024-07-03 ENCOUNTER — Ambulatory Visit: Admitting: Physical Therapy

## 2024-07-10 ENCOUNTER — Ambulatory Visit: Admitting: Physical Therapy

## 2024-07-17 ENCOUNTER — Ambulatory Visit: Admitting: Physical Therapy

## 2024-07-18 ENCOUNTER — Encounter: Payer: Self-pay | Admitting: Obstetrics and Gynecology

## 2024-10-22 ENCOUNTER — Encounter: Payer: Self-pay | Admitting: *Deleted
